# Patient Record
Sex: Female | Born: 1995 | Hispanic: Yes | Marital: Single | Smoking: Never smoker
Health system: Southern US, Community
[De-identification: ages and names within clinical notes are randomized; demographics above are authoritative.]

## PROBLEM LIST (undated history)

## (undated) DIAGNOSIS — R519 Headache, unspecified: Secondary | ICD-10-CM

## (undated) DIAGNOSIS — R51 Headache: Secondary | ICD-10-CM

## (undated) DIAGNOSIS — T148XXA Other injury of unspecified body region, initial encounter: Secondary | ICD-10-CM

## (undated) DIAGNOSIS — Z789 Other specified health status: Secondary | ICD-10-CM

## (undated) HISTORY — PX: NO PAST SURGERIES: SHX2092

---

## 2017-04-20 LAB — OB RESULTS CONSOLE ABO/RH: RH Type: POSITIVE

## 2017-04-20 LAB — OB RESULTS CONSOLE ANTIBODY SCREEN: Antibody Screen: NEGATIVE

## 2017-04-20 LAB — OB RESULTS CONSOLE RUBELLA ANTIBODY, IGM: RUBELLA: IMMUNE

## 2017-04-20 LAB — OB RESULTS CONSOLE GC/CHLAMYDIA
Chlamydia: NEGATIVE
Gonorrhea: NEGATIVE

## 2017-04-20 LAB — OB RESULTS CONSOLE HEPATITIS B SURFACE ANTIGEN: Hepatitis B Surface Ag: NEGATIVE

## 2017-04-20 LAB — OB RESULTS CONSOLE RPR: RPR: NONREACTIVE

## 2017-04-20 LAB — OB RESULTS CONSOLE HIV ANTIBODY (ROUTINE TESTING): HIV: NONREACTIVE

## 2017-07-15 LAB — OB RESULTS CONSOLE GC/CHLAMYDIA
CHLAMYDIA, DNA PROBE: NEGATIVE
GC PROBE AMP, GENITAL: NEGATIVE

## 2017-07-15 LAB — OB RESULTS CONSOLE GBS: GBS: NEGATIVE

## 2017-08-10 ENCOUNTER — Telehealth (HOSPITAL_COMMUNITY): Payer: Self-pay | Admitting: *Deleted

## 2017-08-10 ENCOUNTER — Encounter (HOSPITAL_COMMUNITY): Payer: Self-pay | Admitting: *Deleted

## 2017-08-10 NOTE — Telephone Encounter (Signed)
Preadmission screen  

## 2017-08-11 ENCOUNTER — Other Ambulatory Visit: Payer: Self-pay | Admitting: Advanced Practice Midwife

## 2017-08-14 ENCOUNTER — Inpatient Hospital Stay (HOSPITAL_COMMUNITY): Payer: Medicaid Other | Admitting: Anesthesiology

## 2017-08-14 ENCOUNTER — Inpatient Hospital Stay (HOSPITAL_COMMUNITY)
Admission: AD | Admit: 2017-08-14 | Discharge: 2017-08-17 | DRG: 765 | Disposition: A | Discharge status: Home / Self Care | Payer: Medicaid Other | Source: Ambulatory Visit | Attending: Obstetrics & Gynecology | Admitting: Obstetrics & Gynecology

## 2017-08-14 ENCOUNTER — Encounter (HOSPITAL_COMMUNITY): Payer: Self-pay

## 2017-08-14 ENCOUNTER — Encounter (HOSPITAL_COMMUNITY)
Admission: AD | Disposition: A | Discharge status: Home / Self Care | Payer: Self-pay | Source: Ambulatory Visit | Attending: Obstetrics & Gynecology

## 2017-08-14 DIAGNOSIS — O480 Post-term pregnancy: Principal | ICD-10-CM | POA: Diagnosis present

## 2017-08-14 DIAGNOSIS — O411230 Chorioamnionitis, third trimester, not applicable or unspecified: Secondary | ICD-10-CM | POA: Diagnosis present

## 2017-08-14 DIAGNOSIS — E669 Obesity, unspecified: Secondary | ICD-10-CM | POA: Diagnosis present

## 2017-08-14 DIAGNOSIS — Z6831 Body mass index (BMI) 31.0-31.9, adult: Secondary | ICD-10-CM | POA: Diagnosis not present

## 2017-08-14 DIAGNOSIS — O323XX0 Maternal care for face, brow and chin presentation, not applicable or unspecified: Secondary | ICD-10-CM | POA: Diagnosis present

## 2017-08-14 DIAGNOSIS — Z3A40 40 weeks gestation of pregnancy: Secondary | ICD-10-CM | POA: Diagnosis not present

## 2017-08-14 DIAGNOSIS — Z98891 History of uterine scar from previous surgery: Secondary | ICD-10-CM

## 2017-08-14 DIAGNOSIS — O99214 Obesity complicating childbirth: Secondary | ICD-10-CM | POA: Diagnosis present

## 2017-08-14 DIAGNOSIS — Z3A41 41 weeks gestation of pregnancy: Secondary | ICD-10-CM | POA: Diagnosis present

## 2017-08-14 HISTORY — DX: Other specified health status: Z78.9

## 2017-08-14 LAB — TYPE AND SCREEN
ABO/RH(D): A POS
Antibody Screen: NEGATIVE

## 2017-08-14 LAB — CBC
HCT: 39 % (ref 36.0–46.0)
Hemoglobin: 13.5 g/dL (ref 12.0–15.0)
MCH: 31.2 pg (ref 26.0–34.0)
MCHC: 34.6 g/dL (ref 30.0–36.0)
MCV: 90.1 fL (ref 78.0–100.0)
PLATELETS: 204 10*3/uL (ref 150–400)
RBC: 4.33 MIL/uL (ref 3.87–5.11)
RDW: 14.1 % (ref 11.5–15.5)
WBC: 11.4 10*3/uL — ABNORMAL HIGH (ref 4.0–10.5)

## 2017-08-14 LAB — ABO/RH: ABO/RH(D): A POS

## 2017-08-14 SURGERY — Surgical Case
Anesthesia: Epidural | Site: Abdomen | Wound class: Clean Contaminated

## 2017-08-14 MED ORDER — MEPERIDINE HCL 25 MG/ML IJ SOLN
INTRAMUSCULAR | Status: AC
Start: 2017-08-14 — End: 2017-08-14
  Filled 2017-08-14: qty 1

## 2017-08-14 MED ORDER — EPHEDRINE 5 MG/ML INJ
10.0000 mg | INTRAVENOUS | Status: AC | PRN
  Administered 2017-08-14 (×2): 10 mg via INTRAVENOUS

## 2017-08-14 MED ORDER — METHYLERGONOVINE MALEATE 0.2 MG/ML IJ SOLN
INTRAMUSCULAR | Status: DC | PRN
  Administered 2017-08-14: 0.2 mg via INTRAMUSCULAR

## 2017-08-14 MED ORDER — LACTATED RINGERS IV SOLN
500.0000 mL | INTRAVENOUS | Status: DC | PRN
  Administered 2017-08-14: 500 mL via INTRAVENOUS

## 2017-08-14 MED ORDER — OXYTOCIN 10 UNIT/ML IJ SOLN
INTRAVENOUS | Status: DC | PRN
  Administered 2017-08-14: 40 [IU] via INTRAVENOUS

## 2017-08-14 MED ORDER — CEFAZOLIN SODIUM-DEXTROSE 2-4 GM/100ML-% IV SOLN
INTRAVENOUS | Status: AC
  Filled 2017-08-14: qty 100

## 2017-08-14 MED ORDER — ACETAMINOPHEN 325 MG PO TABS
650.0000 mg | ORAL_TABLET | ORAL | Status: DC | PRN
  Administered 2017-08-14: 650 mg via ORAL
  Filled 2017-08-14: qty 2

## 2017-08-14 MED ORDER — MEPERIDINE HCL 25 MG/ML IJ SOLN
12.5000 mg | Freq: Once | INTRAMUSCULAR | Status: AC
  Administered 2017-08-14: 12.5 mg via INTRAVENOUS
  Filled 2017-08-14: qty 1

## 2017-08-14 MED ORDER — FERROUS SULFATE 325 (65 FE) MG PO TABS
325.0000 mg | ORAL_TABLET | Freq: Two times a day (BID) | ORAL | Status: DC
  Administered 2017-08-15 – 2017-08-17 (×5): 325 mg via ORAL
  Filled 2017-08-14 (×5): qty 1

## 2017-08-14 MED ORDER — OXYCODONE-ACETAMINOPHEN 5-325 MG PO TABS: 2.0000 | ORAL_TABLET | ORAL | Status: DC | PRN

## 2017-08-14 MED ORDER — SENNOSIDES-DOCUSATE SODIUM 8.6-50 MG PO TABS
2.0000 | ORAL_TABLET | ORAL | Status: DC
  Administered 2017-08-15 – 2017-08-16 (×3): 2 via ORAL
  Filled 2017-08-14 (×3): qty 2

## 2017-08-14 MED ORDER — DIBUCAINE 1 % RE OINT: 1.0000 "application " | TOPICAL_OINTMENT | RECTAL | Status: DC | PRN

## 2017-08-14 MED ORDER — MORPHINE SULFATE (PF) 0.5 MG/ML IJ SOLN
INTRAMUSCULAR | Status: DC | PRN
  Administered 2017-08-14: 4 mg via EPIDURAL
  Administered 2017-08-14: 1 mg via INTRAVENOUS

## 2017-08-14 MED ORDER — OXYCODONE-ACETAMINOPHEN 5-325 MG PO TABS: 1.0000 | ORAL_TABLET | ORAL | Status: DC | PRN

## 2017-08-14 MED ORDER — BUPIVACAINE HCL (PF) 0.5 % IJ SOLN
INTRAMUSCULAR | Status: AC
  Filled 2017-08-14: qty 30

## 2017-08-14 MED ORDER — TERBUTALINE SULFATE 1 MG/ML IJ SOLN: 0.2500 mg | Freq: Once | INTRAMUSCULAR | Status: DC | PRN

## 2017-08-14 MED ORDER — LACTATED RINGERS IV SOLN
INTRAVENOUS | Status: DC | PRN
  Administered 2017-08-14 (×3): via INTRAVENOUS

## 2017-08-14 MED ORDER — MEPERIDINE HCL 25 MG/ML IJ SOLN
INTRAMUSCULAR | Status: DC | PRN
  Administered 2017-08-14 (×2): 12.5 mg via INTRAVENOUS

## 2017-08-14 MED ORDER — FENTANYL CITRATE (PF) 100 MCG/2ML IJ SOLN
25.0000 ug | INTRAMUSCULAR | Status: DC | PRN
  Administered 2017-08-14 (×2): 50 ug via INTRAVENOUS

## 2017-08-14 MED ORDER — SIMETHICONE 80 MG PO CHEW
80.0000 mg | CHEWABLE_TABLET | ORAL | Status: DC | PRN
  Administered 2017-08-17: 80 mg via ORAL

## 2017-08-14 MED ORDER — LACTATED RINGERS IV SOLN
INTRAVENOUS | Status: DC
  Administered 2017-08-14 (×4): via INTRAVENOUS

## 2017-08-14 MED ORDER — MEASLES, MUMPS & RUBELLA VAC ~~LOC~~ INJ
0.5000 mL | INJECTION | Freq: Once | SUBCUTANEOUS | Status: DC
  Filled 2017-08-14: qty 0.5

## 2017-08-14 MED ORDER — COCONUT OIL OIL: 1.0000 "application " | TOPICAL_OIL | Status: DC | PRN

## 2017-08-14 MED ORDER — OXYTOCIN 40 UNITS IN LACTATED RINGERS INFUSION - SIMPLE MED
2.5000 [IU]/h | INTRAVENOUS | Status: DC
  Filled 2017-08-14: qty 1000

## 2017-08-14 MED ORDER — DEXAMETHASONE SODIUM PHOSPHATE 4 MG/ML IJ SOLN
INTRAMUSCULAR | Status: DC | PRN
  Administered 2017-08-14: 4 mg via INTRAVENOUS

## 2017-08-14 MED ORDER — SCOPOLAMINE 1 MG/3DAYS TD PT72
MEDICATED_PATCH | TRANSDERMAL | Status: DC | PRN
  Administered 2017-08-14: 1 via TRANSDERMAL

## 2017-08-14 MED ORDER — SODIUM CHLORIDE 0.9 % IJ SOLN
INTRAMUSCULAR | Status: AC
  Filled 2017-08-14: qty 20

## 2017-08-14 MED ORDER — OXYTOCIN 40 UNITS IN LACTATED RINGERS INFUSION - SIMPLE MED: 1.0000 m[IU]/min | INTRAVENOUS | Status: DC

## 2017-08-14 MED ORDER — PHENYLEPHRINE 40 MCG/ML (10ML) SYRINGE FOR IV PUSH (FOR BLOOD PRESSURE SUPPORT)
PREFILLED_SYRINGE | INTRAVENOUS | Status: AC
  Filled 2017-08-14: qty 10

## 2017-08-14 MED ORDER — SCOPOLAMINE 1 MG/3DAYS TD PT72
MEDICATED_PATCH | TRANSDERMAL | Status: AC
Start: 2017-08-14 — End: 2017-08-14
  Filled 2017-08-14: qty 1

## 2017-08-14 MED ORDER — DIPHENHYDRAMINE HCL 50 MG/ML IJ SOLN: 12.5000 mg | INTRAMUSCULAR | Status: DC | PRN

## 2017-08-14 MED ORDER — ACETAMINOPHEN 325 MG PO TABS
650.0000 mg | ORAL_TABLET | ORAL | Status: DC | PRN
  Administered 2017-08-16 – 2017-08-17 (×2): 650 mg via ORAL
  Filled 2017-08-14 (×2): qty 2

## 2017-08-14 MED ORDER — OXYTOCIN 40 UNITS IN LACTATED RINGERS INFUSION - SIMPLE MED
1.0000 m[IU]/min | INTRAVENOUS | Status: DC
  Administered 2017-08-14: 2 m[IU]/min via INTRAVENOUS

## 2017-08-14 MED ORDER — MORPHINE SULFATE (PF) 0.5 MG/ML IJ SOLN
INTRAMUSCULAR | Status: AC
  Filled 2017-08-14: qty 10

## 2017-08-14 MED ORDER — MISOPROSTOL 25 MCG QUARTER TABLET: 25.0000 ug | ORAL_TABLET | ORAL | Status: DC | PRN

## 2017-08-14 MED ORDER — FENTANYL 2.5 MCG/ML BUPIVACAINE 1/10 % EPIDURAL INFUSION (WH - ANES)
INTRAMUSCULAR | Status: AC
  Filled 2017-08-14: qty 100

## 2017-08-14 MED ORDER — ZOLPIDEM TARTRATE 5 MG PO TABS
5.0000 mg | ORAL_TABLET | Freq: Every evening | ORAL | Status: DC | PRN
Start: 2017-08-14 — End: 2017-08-17

## 2017-08-14 MED ORDER — DEXTROSE 5 % IV SOLN
500.0000 mg | Freq: Once | INTRAVENOUS | Status: AC
  Administered 2017-08-14: 500 mg via INTRAVENOUS
  Filled 2017-08-14: qty 500

## 2017-08-14 MED ORDER — ZOLPIDEM TARTRATE 5 MG PO TABS: 5.0000 mg | ORAL_TABLET | Freq: Every evening | ORAL | Status: DC | PRN

## 2017-08-14 MED ORDER — LIDOCAINE HCL (PF) 1 % IJ SOLN: 30.0000 mL | INTRAMUSCULAR | Status: DC | PRN

## 2017-08-14 MED ORDER — MEPERIDINE HCL 25 MG/ML IJ SOLN: 6.2500 mg | INTRAMUSCULAR | Status: DC | PRN

## 2017-08-14 MED ORDER — WITCH HAZEL-GLYCERIN EX PADS: 1.0000 "application " | MEDICATED_PAD | CUTANEOUS | Status: DC | PRN

## 2017-08-14 MED ORDER — PHENYLEPHRINE 40 MCG/ML (10ML) SYRINGE FOR IV PUSH (FOR BLOOD PRESSURE SUPPORT)
80.0000 ug | PREFILLED_SYRINGE | INTRAVENOUS | Status: DC | PRN
  Administered 2017-08-14 (×2): 80 ug via INTRAVENOUS

## 2017-08-14 MED ORDER — CEFAZOLIN SODIUM-DEXTROSE 2-3 GM-% IV SOLR
INTRAVENOUS | Status: DC | PRN
  Administered 2017-08-14: 2 g via INTRAVENOUS

## 2017-08-14 MED ORDER — GENTAMICIN SULFATE 40 MG/ML IJ SOLN
150.0000 mg | Freq: Three times a day (TID) | INTRAMUSCULAR | Status: DC
  Filled 2017-08-14: qty 3.75

## 2017-08-14 MED ORDER — LIDOCAINE HCL (PF) 1 % IJ SOLN
INTRAMUSCULAR | Status: DC | PRN
  Administered 2017-08-14: 4 mL via EPIDURAL
  Administered 2017-08-14: 3 mL via EPIDURAL

## 2017-08-14 MED ORDER — IBUPROFEN 600 MG PO TABS
600.0000 mg | ORAL_TABLET | Freq: Four times a day (QID) | ORAL | Status: DC
  Administered 2017-08-15 – 2017-08-17 (×11): 600 mg via ORAL
  Filled 2017-08-14 (×11): qty 1

## 2017-08-14 MED ORDER — PHENYLEPHRINE 40 MCG/ML (10ML) SYRINGE FOR IV PUSH (FOR BLOOD PRESSURE SUPPORT): 80.0000 ug | PREFILLED_SYRINGE | INTRAVENOUS | Status: DC | PRN

## 2017-08-14 MED ORDER — OXYTOCIN 10 UNIT/ML IJ SOLN
INTRAMUSCULAR | Status: AC
  Filled 2017-08-14: qty 4

## 2017-08-14 MED ORDER — OXYTOCIN 40 UNITS IN LACTATED RINGERS INFUSION - SIMPLE MED: 2.5000 [IU]/h | INTRAVENOUS | Status: AC

## 2017-08-14 MED ORDER — FENTANYL CITRATE (PF) 100 MCG/2ML IJ SOLN
100.0000 ug | INTRAMUSCULAR | Status: DC | PRN
  Administered 2017-08-14 (×2): 100 ug via INTRAVENOUS
  Filled 2017-08-14 (×2): qty 2

## 2017-08-14 MED ORDER — ONDANSETRON HCL 4 MG/2ML IJ SOLN: 4.0000 mg | Freq: Four times a day (QID) | INTRAMUSCULAR | Status: DC | PRN

## 2017-08-14 MED ORDER — FENTANYL CITRATE (PF) 100 MCG/2ML IJ SOLN
INTRAMUSCULAR | Status: AC
Start: 2017-08-14 — End: 2017-08-15
  Filled 2017-08-14: qty 2

## 2017-08-14 MED ORDER — DIPHENHYDRAMINE HCL 25 MG PO CAPS
25.0000 mg | ORAL_CAPSULE | Freq: Four times a day (QID) | ORAL | Status: DC | PRN
Start: 2017-08-14 — End: 2017-08-17

## 2017-08-14 MED ORDER — SOD CITRATE-CITRIC ACID 500-334 MG/5ML PO SOLN
30.0000 mL | ORAL | Status: DC | PRN
  Administered 2017-08-14: 30 mL via ORAL
  Filled 2017-08-14: qty 15

## 2017-08-14 MED ORDER — PHENYLEPHRINE HCL 10 MG/ML IJ SOLN
INTRAMUSCULAR | Status: DC | PRN
  Administered 2017-08-14: 40 ug via INTRAVENOUS
  Administered 2017-08-14: 80 ug via INTRAVENOUS

## 2017-08-14 MED ORDER — LACTATED RINGERS IV SOLN
500.0000 mL | Freq: Once | INTRAVENOUS | Status: AC
  Administered 2017-08-14: 500 mL via INTRAVENOUS

## 2017-08-14 MED ORDER — METOCLOPRAMIDE HCL 5 MG/ML IJ SOLN: 10.0000 mg | Freq: Once | INTRAMUSCULAR | Status: DC | PRN

## 2017-08-14 MED ORDER — LIDOCAINE-EPINEPHRINE (PF) 2 %-1:200000 IJ SOLN
INTRAMUSCULAR | Status: DC | PRN
  Administered 2017-08-14 (×3): 5 mL via EPIDURAL
  Administered 2017-08-14: 4 mL via EPIDURAL
  Administered 2017-08-14 (×2): 5 mL via EPIDURAL
  Administered 2017-08-14: 4 mL via EPIDURAL

## 2017-08-14 MED ORDER — LACTATED RINGERS IV SOLN
INTRAVENOUS | Status: DC
  Administered 2017-08-15: 03:00:00 via INTRAVENOUS

## 2017-08-14 MED ORDER — CEFAZOLIN SODIUM-DEXTROSE 2-4 GM/100ML-% IV SOLN: 2.0000 g | Freq: Once | INTRAVENOUS | Status: DC

## 2017-08-14 MED ORDER — METHYLERGONOVINE MALEATE 0.2 MG/ML IJ SOLN
INTRAMUSCULAR | Status: AC
  Filled 2017-08-14: qty 1

## 2017-08-14 MED ORDER — AMPICILLIN SODIUM 2 G IJ SOLR
2.0000 g | Freq: Four times a day (QID) | INTRAMUSCULAR | Status: DC
  Administered 2017-08-14: 2 g via INTRAVENOUS
  Filled 2017-08-14 (×2): qty 2000

## 2017-08-14 MED ORDER — TETANUS-DIPHTH-ACELL PERTUSSIS 5-2.5-18.5 LF-MCG/0.5 IM SUSP: 0.5000 mL | Freq: Once | INTRAMUSCULAR | Status: DC

## 2017-08-14 MED ORDER — OXYTOCIN BOLUS FROM INFUSION: 500.0000 mL | Freq: Once | INTRAVENOUS | Status: DC

## 2017-08-14 MED ORDER — ONDANSETRON HCL 4 MG/2ML IJ SOLN
INTRAMUSCULAR | Status: DC | PRN
  Administered 2017-08-14: 4 mg via INTRAVENOUS

## 2017-08-14 MED ORDER — DEXAMETHASONE SODIUM PHOSPHATE 4 MG/ML IJ SOLN
INTRAMUSCULAR | Status: AC
  Filled 2017-08-14: qty 1

## 2017-08-14 MED ORDER — FENTANYL 2.5 MCG/ML BUPIVACAINE 1/10 % EPIDURAL INFUSION (WH - ANES)
14.0000 mL/h | INTRAMUSCULAR | Status: DC | PRN
  Administered 2017-08-14: 12 mL/h via EPIDURAL

## 2017-08-14 MED ORDER — MAGNESIUM HYDROXIDE 400 MG/5ML PO SUSP: 30.0000 mL | ORAL | Status: DC | PRN

## 2017-08-14 MED ORDER — PRENATAL MULTIVITAMIN CH
1.0000 | ORAL_TABLET | Freq: Every day | ORAL | Status: DC
  Administered 2017-08-15 – 2017-08-17 (×3): 1 via ORAL
  Filled 2017-08-14 (×3): qty 1

## 2017-08-14 MED ORDER — EPHEDRINE 5 MG/ML INJ
10.0000 mg | INTRAVENOUS | Status: DC | PRN
  Administered 2017-08-14: 10 mg via INTRAVENOUS
  Filled 2017-08-14 (×2): qty 4

## 2017-08-14 MED ORDER — BUPIVACAINE HCL (PF) 0.5 % IJ SOLN
INTRAMUSCULAR | Status: DC | PRN
  Administered 2017-08-14: 30 mL

## 2017-08-14 MED ORDER — MENTHOL 3 MG MT LOZG: 1.0000 | LOZENGE | OROMUCOSAL | Status: DC | PRN

## 2017-08-14 MED ORDER — SIMETHICONE 80 MG PO CHEW
80.0000 mg | CHEWABLE_TABLET | ORAL | Status: DC
  Administered 2017-08-15 – 2017-08-16 (×4): 80 mg via ORAL
  Filled 2017-08-14 (×4): qty 1

## 2017-08-14 SURGICAL SUPPLY — 34 items
CHLORAPREP W/TINT 26ML (MISCELLANEOUS) ×3 IMPLANT
CLAMP CORD UMBIL (MISCELLANEOUS) IMPLANT
CLOTH BEACON ORANGE TIMEOUT ST (SAFETY) ×3 IMPLANT
DRSG OPSITE POSTOP 4X10 (GAUZE/BANDAGES/DRESSINGS) ×3 IMPLANT
ELECT REM PT RETURN 9FT ADLT (ELECTROSURGICAL) ×3
ELECTRODE REM PT RTRN 9FT ADLT (ELECTROSURGICAL) ×1 IMPLANT
EXTRACTOR VACUUM M CUP 4 TUBE (SUCTIONS) ×2 IMPLANT
EXTRACTOR VACUUM M CUP 4' TUBE (SUCTIONS) ×1
GAUZE SPONGE 4X4 12PLY STRL (GAUZE/BANDAGES/DRESSINGS) ×3 IMPLANT
GLOVE BIOGEL PI IND STRL 7.0 (GLOVE) ×4 IMPLANT
GLOVE BIOGEL PI INDICATOR 7.0 (GLOVE) ×8
GLOVE ECLIPSE 7.0 STRL STRAW (GLOVE) ×6 IMPLANT
GOWN STRL REUS W/TWL LRG LVL3 (GOWN DISPOSABLE) ×9 IMPLANT
KIT ABG SYR 3ML LUER SLIP (SYRINGE) IMPLANT
NEEDLE HYPO 22GX1.5 SAFETY (NEEDLE) ×3 IMPLANT
NEEDLE HYPO 25X5/8 SAFETYGLIDE (NEEDLE) ×3 IMPLANT
NS IRRIG 1000ML POUR BTL (IV SOLUTION) ×3 IMPLANT
PACK C SECTION WH (CUSTOM PROCEDURE TRAY) ×3 IMPLANT
PAD ABD 7.5X8 STRL (GAUZE/BANDAGES/DRESSINGS) ×3 IMPLANT
PAD ABD 8X10 STRL (GAUZE/BANDAGES/DRESSINGS) ×3 IMPLANT
PAD OB MATERNITY 4.3X12.25 (PERSONAL CARE ITEMS) ×3 IMPLANT
PENCIL BUTTON HOLSTER BLD 10FT (ELECTRODE) ×3 IMPLANT
PENCIL SMOKE EVAC W/HOLSTER (ELECTROSURGICAL) ×3 IMPLANT
RTRCTR C-SECT PINK 25CM LRG (MISCELLANEOUS) ×3 IMPLANT
SPONGE LAP 18X18 X RAY DECT (DISPOSABLE) ×6 IMPLANT
SUT PDS AB 0 CTX 36 PDP370T (SUTURE) IMPLANT
SUT PLAIN 2 0 XLH (SUTURE) IMPLANT
SUT VIC AB 0 CTX 36 (SUTURE) ×6
SUT VIC AB 0 CTX36XBRD ANBCTRL (SUTURE) ×3 IMPLANT
SUT VIC AB 2-0 CT1 (SUTURE) ×3 IMPLANT
SUT VIC AB 4-0 KS 27 (SUTURE) ×3 IMPLANT
SYR CONTROL 10ML LL (SYRINGE) ×3 IMPLANT
TOWEL OR 17X24 6PK STRL BLUE (TOWEL DISPOSABLE) ×3 IMPLANT
TRAY FOLEY BAG SILVER LF 14FR (SET/KITS/TRAYS/PACK) ×3 IMPLANT

## 2017-08-14 NOTE — Progress Notes (Signed)
Patient evaluated at bedside with Zerita Boers, CNM for recurrent late variable decelerations following adjustment of her epidural. Patient was found to be hypotensive as low as 80s/40s. Started oxygen administration and repositioned patient and started IVF bolus. Patient given 3 doses phenylephrine and 3 doses ephedrine. Finally, blood pressure improved to 150s/90s. FHT improved as well with improvement of BP.  FHT 170/no acels/no decels/ min variability  Continue to monitor closely.   Rolm Bookbinder, DO [place] Fellow

## 2017-08-14 NOTE — Progress Notes (Signed)
Pt began involuntarily shaking and wanting to "push". RN performed sterile vaginal exam, no change from previous exam. Pt reports SHOB. Anesthesia and CNM notified of patient condition.

## 2017-08-14 NOTE — Progress Notes (Signed)
Pt shaking due to pain from contraction

## 2017-08-14 NOTE — Anesthesia Pain Management Evaluation Note (Signed)
  CRNA Pain Management Visit Note  Patient: Angela Decker, 21 y.o., female  "Hello I am a member of the anesthesia team at [facility]. We have an anesthesia team available at all times to provide care throughout the hospital, including epidural management and anesthesia for C-section. I don't know your plan for the delivery whether it a natural birth, water birth, IV sedation, nitrous supplementation, doula or epidural, but we want to meet your pain goals."   1.Was your pain managed to your expectations on prior hospitalizations?   No prior hospitalizations  2.What is your expectation for pain management during this hospitalization?     Epidural  3.How can we help you reach that goal? *support prn**  Record the patient's initial score and the patient's pain goal.   Pain: 0  Pain Goal: 4 [facility] wants you to be able to say your pain was always managed very well.  [facility] 08/14/2017

## 2017-08-14 NOTE — Progress Notes (Signed)
10 mg of ephedrine administered. Marlynn Perking at bedside

## 2017-08-14 NOTE — Op Note (Signed)
Angela Decker PROCEDURE DATE: 08/14/2017  PREOPERATIVE DIAGNOSES: Intrauterine pregnancy at [redacted]w[redacted]d weeks gestation; chorioamnionitis and non-reassuring fetal status  POSTOPERATIVE DIAGNOSES: The same  PROCEDURE: Primary Low Transverse Cesarean Section  SURGEON:  Dr. Jaynie Collins  ASSISTANT:  Dr. Rolm Bookbinder  ANESTHESIOLOGY TEAM: Anesthesiologist: Mal Amabile, MD CRNA: Armanda Heritage, CRNA  INDICATIONS: Angela Decker is a 21 y.o. G1P1000 at [redacted]w[redacted]d here for cesarean section secondary to the indications listed under preoperative diagnoses; please see preoperative note for further details.  The risks of cesarean section were discussed with the patient including but were not limited to: bleeding which may require transfusion or reoperation; infection which may require antibiotics; injury to bowel, bladder, ureters or other surrounding organs; injury to the fetus; need for additional procedures including hysterectomy in the event of a life-threatening hemorrhage; placental abnormalities wth subsequent pregnancies, incisional problems, thromboembolic phenomenon and other postoperative/anesthesia complications.   The patient concurred with the proposed plan, giving informed written consent for the procedure.    FINDINGS:  Viable female infant in cephalic presentation.  Asynclitic, face presentation. Apgars 8 and 9. Nuchal cord x 2.   Clear amniotic fluid.  Intact placenta, three vessel cord.  Normal uterus, fallopian tubes and ovaries bilaterally.  ANESTHESIA: Epidural  INTRAVENOUS FLUIDS: 3100 ml   ESTIMATED BLOOD LOSS: 731 ml URINE OUTPUT:  1450 ml SPECIMENS: Placenta sent to pathology COMPLICATIONS: None immediate  PROCEDURE IN DETAIL:  The patient preoperatively received intravenous antibiotics and had sequential compression devices applied to her lower extremities.  She was then taken to the operating room where the epidural anesthesia was dosed up to surgical leveland  was found to be adequate. She was then placed in a dorsal supine position with a leftward tilt, and prepped and draped in a sterile manner.  A foley catheter was placed into her bladder and attached to constant gravity.  After an adequate timeout was performed, a Pfannenstiel skin incision was made with scalpel and carried through to the underlying layer of fascia. The fascia was incised in the midline, and this incision was extended bilaterally using the Mayo scissors.  Kocher clamps were applied to the superior aspect of the fascial incision and the underlying rectus muscles were dissected off bluntly.  A similar process was carried out on the inferior aspect of the fascial incision. The rectus muscles were separated in the midline bluntly and the peritoneum was entered bluntly. Attention was turned to the lower uterine segment where a low transverse hysterotomy was made with a scalpel and extended bilaterally bluntly.  The infant was successfully delivered, the cord was clamped and cut, after one minute, and the infant was handed over to the awaiting neonatology team. Uterine massage was then administered, and the placenta delivered intact with a three-vessel cord. The uterus was then cleared of clots and debris.  The hysterotomy was closed with 0 Vicryl in a running locked fashion, and an imbricating layer was also placed with 0 Vicryl.  Figure-of-eight 0 Vicryl serosal stitches were placed to help with hemostasis.  The pelvis was cleared of all clot and debris. Hemostasis was confirmed on all surfaces.  The peritoneum was closed with a 0 Vicryl running stitch and the rectus muscles were reapproximated using 0 Vicryl interrupted stitches. The fascia was then closed using 0 Vicryl in a running fashion.  The subcutaneous layer was irrigated, and 30 ml of 0.5% Marcaine was injected subcutaneously around the incision.  The skin was closed with a 4-0 Vicryl subcuticular stitch.  The patient tolerated the procedure  well. Sponge, lap, instrument and needle counts were correct x 3.  She was taken to the recovery room in stable condition.    Jaynie Collins, MD, FACOG Attending Obstetrician & Gynecologist Faculty Practice, [facility]

## 2017-08-14 NOTE — Anesthesia Procedure Notes (Signed)
Epidural Patient location during procedure: OB Start time: 08/14/2017 2:47 PM  Staffing Anesthesiologist: Mal Amabile Performed: anesthesiologist   Preanesthetic Checklist Completed: patient identified, site marked, surgical consent, pre-op evaluation, timeout performed, IV checked, risks and benefits discussed and monitors and equipment checked  Epidural Patient position: sitting Prep: site prepped and draped and DuraPrep Patient monitoring: continuous pulse ox and blood pressure Approach: midline Location: L3-L4 Injection technique: LOR air  Needle:  Needle type: Tuohy  Needle gauge: 17 G Needle length: 9 cm and 9 Needle insertion depth: 5 cm cm Catheter type: closed end flexible Catheter size: 19 Gauge Catheter at skin depth: 10 cm Test dose: negative and Other  Assessment Events: blood not aspirated, injection not painful, no injection resistance, negative IV test and no paresthesia  Additional Notes Patient identified. Risks and benefits discussed including failed block, incomplete  Pain control, post dural puncture headache, nerve damage, paralysis, blood pressure Changes, nausea, vomiting, reactions to medications-both toxic and allergic and post Partum back pain. All questions were answered. Patient expressed understanding and wished to proceed. Sterile technique was used throughout procedure. Epidural site was Dressed with sterile barrier dressing. No paresthesias, signs of intravascular injection Or signs of intrathecal spread were encountered.  Patient was more comfortable after the epidural was dosed. Please see RN's note for documentation of vital signs and FHR which are stable.

## 2017-08-14 NOTE — Anesthesia Procedure Notes (Signed)
Epidural Patient location during procedure: OB Start time: 08/14/2017 3:45 PM  Staffing Anesthesiologist: Mal Amabile Performed: anesthesiologist   Preanesthetic Checklist Completed: patient identified, site marked, surgical consent, pre-op evaluation, timeout performed, IV checked, risks and benefits discussed and monitors and equipment checked  Epidural Patient position: sitting Prep: site prepped and draped and DuraPrep Patient monitoring: continuous pulse ox and blood pressure Approach: midline Location: L3-L4 Injection technique: LOR air  Needle:  Needle type: Tuohy  Needle gauge: 17 G Needle length: 9 cm and 9 Needle insertion depth: 5 cm cm Catheter type: closed end flexible Catheter size: 19 Gauge Catheter at skin depth: 10 cm Test dose: negative and Other  Assessment Events: blood not aspirated, injection not painful, no injection resistance, negative IV test and no paresthesia  Additional Notes Patient identified. Risks and benefits discussed including failed block, incomplete  Pain control, post dural puncture headache, nerve damage, paralysis, blood pressure Changes, nausea, vomiting, reactions to medications-both toxic and allergic and post Partum back pain. All questions were answered. Patient expressed understanding and wished to proceed. Sterile technique was used throughout procedure. Epidural site was Dressed with sterile barrier dressing. No paresthesias, signs of intravascular injection Or signs of intrathecal spread were encountered.  Patient was more comfortable after the epidural was dosed. Please see RN's note for documentation of vital signs and FHR which are stable.\

## 2017-08-14 NOTE — MAU Note (Signed)
I have communicated with Dr. Frances Furbish and reviewed vital signs:  Vitals:   08/14/17 0554  BP: 121/75  Pulse: 74  Resp: 16  Temp: 98.1 F (36.7 C)    Vaginal exam:  Dilation: 1.5 Effacement (%): 60 Cervical Position: Posterior Station: -2 Presentation: Vertex Exam by:: DCALLAWAY, RN  ,   Also reviewed contraction pattern and that non-stress test is reactive.  It has been documented that patient is contracting every 3-6 minutes with no cervical change over 1 hours not indicating active labor.  Patient denies any other complaints.   Dr. Frances Furbish will report off to on coming provider, EFM strip will be reviewed and a decision will be made regarding patient's plan of care.

## 2017-08-14 NOTE — H&P (Signed)
OBSTETRIC ADMISSION HISTORY AND PHYSICAL  Angela Decker is a 21 y.o. female G1P0 with IUP at [redacted]w[redacted]d by 20w [place] presenting for IOL for post-dates. She was scheduled for IOL 9/23 , but came to MAU for false labor and had some variables, so was admitted for earlier IOL. She reports +FMs, No LOF, no VB, no blurry vision, headaches or peripheral edema, and RUQ pain.  She plans on breastfeeding. She request POPs for birth control. She received her prenatal care at [facility]   Dating: By 20w [place] --->  Estimated Date of Delivery: 08/08/17  Sono:   04/26/17 26w No [place] available for review in Epic Imaging tab or Media tab   Prenatal History/Complications:  Past Medical History: Past Medical History:  Diagnosis Date  . Medical history non-contributory     Past Surgical History: Past Surgical History:  Procedure Laterality Date  . NO PAST SURGERIES      Obstetrical History: OB History    Gravida Para Term Preterm AB Living   1             SAB TAB Ectopic Multiple Live Births                  Social History: Social History   Social History  . Marital status: Single    Spouse name: N/A  . Number of children: N/A  . Years of education: N/A   Social History Main Topics  . Smoking status: Never Smoker  . Smokeless tobacco: Never Used  . Alcohol use No  . Drug use: No  . Sexual activity: Not Asked   Other Topics Concern  . None   Social History Narrative  . None    Family History: History reviewed. No pertinent family history.  Allergies: No Known Allergies  Prescriptions Prior to Admission  Medication Sig Dispense Refill Last Dose  . Prenatal Vit-Fe Fumarate-FA (PRENATAL MULTIVITAMIN) TABS tablet Take 1 tablet by mouth daily at 12 noon.   08/13/2017 at Unknown time     Review of Systems   All systems reviewed and negative except as stated in HPI  Blood pressure 123/85, pulse 96, temperature 98.7 F (37.1 C), temperature source Oral, resp. rate 18, height 5' 2.5"  (1.588 m), weight 79.8 kg (176 lb). General appearance: alert, cooperative and no distress Lungs: clear to auscultation bilaterally Heart: regular rate and rhythm Abdomen: soft, non-tender; bowel sounds normal Pelvic: adequate Extremities: Homans sign is negative, no sign of DVT Presentation: cephalic Fetal monitoring: Baseline: 150 bpm, Variability: Good {> 6 bpm), Accelerations: Reactive and Decelerations: Absent Uterine activity q8 Dilation: 1.5 Effacement (%): 60 Station: -2 Exam by:: DCALLAWAY, RN     Prenatal labs: ABO, Rh: A/Positive/-- (05/29 0000) Antibody: Negative (05/29 0000) Rubella: Immune (05/29 0000) RPR: Nonreactive (05/29 0000)  HBsAg: Negative (05/29 0000)  HIV: Non-reactive (05/29 0000)  GBS: Negative (08/23 0000)  1 hr Glucola not done Genetic screening not done - too late to care Anatomy US not available  Prenatal Transfer Tool  Maternal Diabetes: Not done Genetic Screening: Too late to care Maternal Ultrasounds/Referrals: Not available for review Fetal Ultrasounds or other Referrals:  None Maternal Substance Abuse:  No Significant Maternal Medications:  None Significant Maternal Lab Results: None  No results found for this or any previous visit (from the past 24 hour(s)).  Patient Active Problem List   Diagnosis Date Noted  . Post term pregnancy at [redacted] weeks gestation 08/14/2017    Assessment/Plan:  Angela Decker is  a 21 y.o. G1P0 at [redacted]w[redacted]d here for IOL for post dates and variables in MAU when presenting for false labor.  #Labor: s/p FB, will also start pitocin #Pain: Considering epidural #FWB:  cat 1, reassuring - no variables since transfer from MAU #ID:  GBS negative #MOF: breast #MOC: POPs #Circ:  unsure  Burnard Leigh, MD  08/14/2017, 9:47 AM

## 2017-08-14 NOTE — Progress Notes (Signed)
ANTIBIOTIC CONSULT NOTE - INITIAL  Pharmacy Consult for Gentamicin Indication: Chorioamnionitis   No Known Allergies  Patient Measurements: Height: 5' 2.5" (158.8 cm) Weight: 176 lb (79.8 kg) IBW/kg (Calculated) : 51.25 Adjusted Body Weight: 60 kg  Vital Signs: Temp: 100.7 F (38.2 C) (09/22 1801) Temp Source: Oral (09/22 1801) BP: 134/77 (09/22 1815) Pulse Rate: 109 (09/22 1815)  Labs:  Recent Labs  08/14/17 0931  WBC 11.4*  HGB 13.5  PLT 204   No results for input(s): GENTTROUGH, GENTPEAK, GENTRANDOM in the last 72 hours.   Microbiology: No results found for this or any previous visit (from the past 720 hour(s)).  Medications:  Ampicillin 2gm IV q6hrs   Assessment: 21 y.o. female G1P0 at [redacted]w[redacted]d on amp and gent for chorio.  Estimated Ke = 0.03 , Vd =24L Goal of Therapy:  Gentamicin peak 6-8 mg/L and Trough < 1 mg/L  Plan:  Gentamicin 150 mg IV every 8 hrs  Check Scr with next labs if gentamicin continued. Will check gentamicin levels if continued > 72hr or clinically indicated.  Wendie Simmer, PharmD, BCPS Clinical Pharmacist

## 2017-08-14 NOTE — Progress Notes (Signed)
Pt sitting for epidural

## 2017-08-14 NOTE — Anesthesia Postprocedure Evaluation (Signed)
Anesthesia Post Note  Patient: Angela Decker  Procedure(s) Performed: Procedure(s) (LRB): CESAREAN SECTION (N/A)     Patient location during evaluation: PACU Anesthesia Type: Epidural Level of consciousness: awake and alert and oriented Pain management: pain level controlled Vital Signs Assessment: post-procedure vital signs reviewed and stable Respiratory status: spontaneous breathing, nonlabored ventilation and respiratory function stable Cardiovascular status: blood pressure returned to baseline and stable Postop Assessment: no headache, no backache, epidural receding, patient able to bend at knees and no apparent nausea or vomiting Anesthetic complications: no    Last Vitals:  Vitals:   08/14/17 2200 08/14/17 2215  BP: 134/77   Pulse: 89 93  Resp: (!) 22 (!) 21  Temp:    SpO2: 97% 96%    Last Pain:  Vitals:   08/14/17 2145  TempSrc: Oral  PainSc:    Pain Goal: Patients Stated Pain Goal: 1 (08/14/17 1610)               Shantrell Placzek A.

## 2017-08-14 NOTE — Progress Notes (Signed)
Shaking after epidural. FHR 150, mod, +accels, no decels. Increase pit. IUPC placed. Burna Cash, MD Family Medicine Resident, PGY-3 08/14/2017 4:27 PM

## 2017-08-14 NOTE — Progress Notes (Signed)
Labor Progress Note Angela Decker is a 21 y.o. G1P0 at [redacted]w[redacted]d presented for IOL for postdates S: No complaints  O:  BP 123/69   Pulse 85   Temp 98.3 F (36.8 C) (Oral)   Resp 18   Ht 5' 2.5" (1.588 m)   Wt 79.8 kg (176 lb)   BMI 31.68 kg/m  EFM: 140/pos acels/no decels  CVE: Dilation: 8 Effacement (%): 80 Cervical Position: Posterior Station: -1 Presentation: Vertex Exam by:: Marlynn Perking CNM   A&P: 21 y.o. G1P0 [redacted]w[redacted]d here for IOL for postdates #Labor: Progressing well. Continue to monitor. SROM noted during exam.  #GBS negative   Hosea Hanawalt, DO 2:09 PM

## 2017-08-14 NOTE — Progress Notes (Signed)
Dr Malen Gauze and Marlynn Perking aware, pt shaking, unable to obtain accurate BP.

## 2017-08-14 NOTE — Progress Notes (Signed)
Patient ID: Angela Decker, female   DOB: 05-15-1996, 22 y.o.   MRN: [id number] S: comfortable from epidural O: pt now febrile, fetal tachycardia, no decels at present time. Dr. Macon Large given progress report. A: Triple I P: tylenol po, start antibiotics, close obs

## 2017-08-14 NOTE — Progress Notes (Signed)
Pt on birthing ball. BP rechecked d/t maternal movement.

## 2017-08-14 NOTE — Progress Notes (Signed)
Dr Malen Gauze stopped epidural pump

## 2017-08-14 NOTE — Progress Notes (Signed)
Ephedrine administered. Angela Decker at bedside

## 2017-08-14 NOTE — Anesthesia Preprocedure Evaluation (Addendum)
Anesthesia Evaluation  Patient identified by MRN, date of birth, ID band Patient awake    Reviewed: Allergy & Precautions, NPO status , Patient's Chart, lab work & pertinent test results  Airway Mallampati: III  TM Distance: >3 FB Neck ROM: Full    Dental no notable dental hx. (+) Teeth Intact   Pulmonary neg pulmonary ROS,    Pulmonary exam normal breath sounds clear to auscultation       Cardiovascular negative cardio ROS Normal cardiovascular exam Rhythm:Regular Rate:Normal     Neuro/Psych negative neurological ROS  negative psych ROS   GI/Hepatic Neg liver ROS, GERD  ,  Endo/Other  Obesity  Renal/GU negative Renal ROS  negative genitourinary   Musculoskeletal negative musculoskeletal ROS (+)   Abdominal (+) + obese,   Peds  Hematology negative hematology ROS (+)   Anesthesia Other Findings   Reproductive/Obstetrics (+) Pregnancy                            Anesthesia Physical Anesthesia Plan  ASA: II and emergent  Anesthesia Plan: Epidural   Post-op Pain Management:    Induction:   PONV Risk Score and Plan:   Airway Management Planned: Natural Airway  Additional Equipment:   Intra-op Plan:   Post-operative Plan:   Informed Consent: I have reviewed the patients History and Physical, chart, labs and discussed the procedure including the risks, benefits and alternatives for the proposed anesthesia with the patient or authorized representative who has indicated his/her understanding and acceptance.   Dental advisory given  Plan Discussed with: Anesthesiologist, CRNA and Surgeon  Anesthesia Plan Comments: (C/Section for non reassuring FHR tracing. Will use epidural for C/Section. M. Malen Gauze, MD)       Anesthesia Quick Evaluation

## 2017-08-14 NOTE — Transfer of Care (Signed)
Immediate Anesthesia Transfer of Care Note  Patient: Angela Decker  Procedure(s) Performed: Procedure(s): CESAREAN SECTION (N/A)  Patient Location: PACU  Anesthesia Type:Epidural  Level of Consciousness: awake, alert  and oriented  Airway & Oxygen Therapy: Patient Spontanous Breathing  Post-op Assessment: Report given to RN and Post -op Vital signs reviewed and stable  Post vital signs: Reviewed and stable  Last Vitals:  Vitals:   08/14/17 1845 08/14/17 1900  BP: 120/68 118/65  Pulse: (!) 113 (!) 121  Resp: 18 18  Temp:      Last Pain:  Vitals:   08/14/17 1801  TempSrc: Oral  PainSc:       Patients Stated Pain Goal: 1 (08/14/17 0981)  Complications: No apparent anesthesia complications

## 2017-08-14 NOTE — Progress Notes (Signed)
Angela Decker, Dr Renaldo Harrison, and resident at bedside.

## 2017-08-14 NOTE — Progress Notes (Signed)
10 mg ephedrine administered. Marlynn Perking at bedside.

## 2017-08-14 NOTE — Progress Notes (Signed)
Angela Decker, CNM reviewed strip and performed sterile vaginal exam.  Notified Dr Macon Large of patient condition and MD reviewed strip and is ok with the tracing at this time. Orders received to restart pitocin at 3 milliunit  and increase by 1 milliunit.     08/14/17 1815  Vital Signs  BP 134/77  Pulse Rate (!) 109  Resp 18  Provider Notification  Provider Name/Title Zerita Boers, CNM  Method of Notification At bedside

## 2017-08-14 NOTE — MAU Note (Signed)
ctxs since 12mn with some bloody show. For IOL tonight but came now due to ctx

## 2017-08-14 NOTE — Progress Notes (Signed)
Faculty Practice OB/GYN Attending Note   Subjective:  Called to evaluate patient with recurrent variable/late decelerations after pitocin was restarted.  Patient recently diagnosed with Triple I; on Amp/Gent. Fetal tachycardia present.     Objective:  Blood pressure 118/65, pulse (!) 121, temperature (!) 100.7 F (38.2 C), temperature source Oral, resp. rate 18, height 5' 2.5" (1.588 m), weight 176 lb (79.8 kg). FHT  Baseline 180 bpm, minimal variability, np accelerations, variable/late decelerations Toco: q 1-3 min Gen: NAD HENT: Normocephalic, atraumatic Lungs: Normal respiratory effort Heart: Regular rate noted Abdomen: NT gravid fundus, soft Cervix: 7-8/90/-1 Ext: 2+ DTRs, no edema, no cyanosis, negative Homan's sign  Assessment & Plan:  21 y.o. G1P0 at [redacted]w[redacted]d admitted for IOL; now with NRFHT remote from vaginal delivery. Cesarean delivery recommended.  The risks of cesarean section discussed with the patient included but were not limited to: bleeding which may require transfusion or reoperation; infection which may require antibiotics; injury to bowel, bladder, ureters or other surrounding organs; injury to the fetus; need for additional procedures including hysterectomy in the event of a life-threatening hemorrhage; placental abnormalities wth subsequent pregnancies, incisional problems, thromboembolic phenomenon and other postoperative/anesthesia complications. The patient concurred with the proposed plan, giving informed written consent for the procedure.   Anesthesia and OR aware. Preoperative prophylactic antibiotics (Ancef and Azithromycin) and SCDs ordered on call to the OR.  To OR when ready.   Jaynie Collins, MD, FACOG Attending Obstetrician & Gynecologist Faculty Practice, [facility]

## 2017-08-15 ENCOUNTER — Ambulatory Visit (HOSPITAL_COMMUNITY): Admission: RE | Admit: 2017-08-15 | Payer: Self-pay | Source: Ambulatory Visit

## 2017-08-15 ENCOUNTER — Encounter (HOSPITAL_COMMUNITY): Payer: Self-pay | Admitting: Obstetrics & Gynecology

## 2017-08-15 LAB — CBC
HEMATOCRIT: 35.4 % — AB (ref 36.0–46.0)
HEMOGLOBIN: 12 g/dL (ref 12.0–15.0)
MCH: 30.7 pg (ref 26.0–34.0)
MCHC: 33.9 g/dL (ref 30.0–36.0)
MCV: 90.5 fL (ref 78.0–100.0)
Platelets: 166 10*3/uL (ref 150–400)
RBC: 3.91 MIL/uL (ref 3.87–5.11)
RDW: 14.7 % (ref 11.5–15.5)
WBC: 21.2 10*3/uL — AB (ref 4.0–10.5)

## 2017-08-15 LAB — RPR: RPR: NONREACTIVE

## 2017-08-15 MED ORDER — DIPHENHYDRAMINE HCL 25 MG PO CAPS: 25.0000 mg | ORAL_CAPSULE | ORAL | Status: DC | PRN

## 2017-08-15 MED ORDER — INFLUENZA VAC SPLIT QUAD 0.5 ML IM SUSY: 0.5000 mL | PREFILLED_SYRINGE | INTRAMUSCULAR | Status: DC

## 2017-08-15 MED ORDER — DIPHENHYDRAMINE HCL 50 MG/ML IJ SOLN: 12.5000 mg | INTRAMUSCULAR | Status: DC | PRN

## 2017-08-15 MED ORDER — NALBUPHINE HCL 10 MG/ML IJ SOLN: 5.0000 mg | Freq: Once | INTRAMUSCULAR | Status: DC | PRN

## 2017-08-15 MED ORDER — NALBUPHINE HCL 10 MG/ML IJ SOLN: 5.0000 mg | INTRAMUSCULAR | Status: DC | PRN

## 2017-08-15 MED ORDER — ONDANSETRON HCL 4 MG/2ML IJ SOLN: 4.0000 mg | Freq: Three times a day (TID) | INTRAMUSCULAR | Status: DC | PRN

## 2017-08-15 MED ORDER — KETOROLAC TROMETHAMINE 30 MG/ML IJ SOLN: 30.0000 mg | Freq: Four times a day (QID) | INTRAMUSCULAR | Status: AC | PRN

## 2017-08-15 MED ORDER — NALOXONE HCL 2 MG/2ML IJ SOSY
1.0000 ug/kg/h | PREFILLED_SYRINGE | INTRAVENOUS | Status: DC | PRN
  Filled 2017-08-15: qty 2

## 2017-08-15 MED ORDER — NALOXONE HCL 0.4 MG/ML IJ SOLN: 0.4000 mg | INTRAMUSCULAR | Status: DC | PRN

## 2017-08-15 MED ORDER — SODIUM CHLORIDE 0.9% FLUSH: 3.0000 mL | INTRAVENOUS | Status: DC | PRN

## 2017-08-15 NOTE — Anesthesia Postprocedure Evaluation (Signed)
Anesthesia Post Note  Patient: Angela Decker  Procedure(s) Performed: Procedure(s) (LRB): CESAREAN SECTION (N/A)     Patient location during evaluation: Mother Baby Anesthesia Type: Epidural Level of consciousness: awake and alert and oriented Pain management: pain level controlled Vital Signs Assessment: post-procedure vital signs reviewed and stable Respiratory status: spontaneous breathing and nonlabored ventilation Cardiovascular status: stable Postop Assessment: no headache, patient able to bend at knees, no backache, no apparent nausea or vomiting, epidural receding and adequate PO intake Anesthetic complications: no    Last Vitals:  Vitals:   08/15/17 0240 08/15/17 0625  BP:  (!) 105/53  Pulse:  64  Resp: 18 18  Temp: 37.2 C 36.5 C  SpO2: 98% 98%    Last Pain:  Vitals:   08/15/17 0625  TempSrc: Oral  PainSc:    Pain Goal: Patients Stated Pain Goal: 1 (08/14/17 0623)               Angela Decker

## 2017-08-15 NOTE — Addendum Note (Signed)
Addendum  created 08/15/17 0801 by Elgie Congo, CRNA   Charge Capture section accepted, Sign clinical note

## 2017-08-15 NOTE — Lactation Note (Signed)
This note was copied from a baby's chart. Lactation Consultation Note  Patient Name: Angela Decker Today's Date: 08/15/2017 Reason for consult: Initial assessment   With this first time mom and term baby, now 58 hours old. Mom is mostly formula feeding, states baby breast feeding a little. Mom had just formula fed baby, so I asked her to call for lactation when baby next shows hunger cues. I will call the Spanish inter[preter to be with me to do BF teaching. I did give mom the Spanish lactation folder.     Maternal Data Formula Feeding for Exclusion: Yes Reason for exclusion: Mother's choice to formula and breast feed on admission Has patient been taught Hand Expression?: Yes Does the patient have breastfeeding experience prior to this delivery?: No  Feeding    LATCH Score    Audible Swallowing:  (easily expressed colostrum)  Type of Nipple: Flat  Comfort (Breast/Nipple): Soft / non-tender        Interventions Interventions: Breast feeding basics reviewed;Hand express  Lactation Tools Discussed/Used     Consult Status Consult Status: Follow-up Date: 08/16/17 Follow-up type: In-patient    Alfred Levins 08/15/2017, 1:48 PM

## 2017-08-15 NOTE — Progress Notes (Signed)
Post Operative Day 1  Subjective:  Angela Decker is a 21 y.o. G1P1000 [redacted]w[redacted]d s/p rLTCS.  No acute events overnight.  Pt denies problems with ambulating, voiding or po intake.  She denies nausea or vomiting.  Pain is well controlled.  She has had flatus. She has not had bowel movement.  Lochia Minimal.  Plan for birth control is oral progesterone-only contraceptive.  Method of Feeding: breast  Objective: BP (!) 105/53 (BP Location: Left Arm)   Pulse 64   Temp 97.7 F (36.5 C) (Oral)   Resp 18   Ht 5' 2.5" (1.588 m)   Wt 79.8 kg (176 lb)   SpO2 98%   Breastfeeding? Unknown   BMI 31.68 kg/m    Adequate urine in foley  Physical Exam:  General: alert, cooperative and no distress Lochia:normal flow Chest: CTAB Heart: RRR no m/r/g Abdomen: +BS, soft, nontender, fundus firm at/below umbilicus Uterine Fundus: firm at umbilicus Wound: pressure dressing in place DVT Evaluation: No evidence of DVT seen on physical exam. Extremities: no edema   Recent Labs  08/14/17 0931 08/15/17 0510  HGB 13.5 12.0  HCT 39.0 35.4*    Assessment/Plan:  ASSESSMENT: Angela Decker is a 21 y.o. G1P1000 [redacted]w[redacted]d POD#1 s/p rLTCS for NRFHT, doing well.  #POD#1: remove foley catheter and have patient ambulate today, hgb 13.5 > 12 #Triple I: Tmax post-op 100.7 , s/p amp x1 + azithro x1 #Limited PNC #MOF: breast #MOC: POPs   Plan for discharge tomorrow vs. The next day pending routine couplet care and meeting post-op milestones   LOS: 1 day   Jenna Diggs 08/15/2017, 7:23 AM  I assessed this pt and agree with above assessment

## 2017-08-15 NOTE — Progress Notes (Signed)
Attempted to walk patient to bathroom to remove foley with interpreters assistance.  Pt began to feel dizzy, unable to walk pt to bathroom to remove foley.  Peri care performed and pad changed at beside.  Will attempt to remove foley after patient eats lunch.

## 2017-08-16 NOTE — Plan of Care (Signed)
Problem: Role Relationship: Goal: Ability to demonstrate positive interaction with newborn will improve Outcome: Completed/Met Date Met: 08/16/17 Patient is bonding well with newborn.    

## 2017-08-16 NOTE — Progress Notes (Signed)
Subjective: Postpartum Day 3: Cesarean Delivery Patient reports incisional pain, + flatus and no problems voiding.    Objective: Vital signs in last 24 hours: Temp:  [97.8 F (36.6 C)-98.2 F (36.8 C)] 97.8 F (36.6 C) (09/24 0612) Pulse Rate:  [60-82] 60 (09/24 0612) Resp:  [17-18] 18 (09/24 0612) BP: (99-109)/(43-55) 108/46 (09/24 0612) SpO2:  [98 %-100 %] 98 % (09/24 0612)  Physical Exam:  General: alert, cooperative and no distress Lochia: appropriate Uterine Fundus: firm Incision: healing well DVT Evaluation: No evidence of DVT seen on physical exam.   Recent Labs  08/14/17 0931 08/15/17 0510  HGB 13.5 12.0  HCT 39.0 35.4*    Assessment/Plan: Status post Cesarean section. Doing well postoperatively.  DC home tomorrow. Patient still needing lactation support, pain management, and baby staying tonight as well.   Thressa Sheller 08/16/2017, 10:06 AM

## 2017-08-16 NOTE — Lactation Note (Signed)
This note was copied from a baby's chart. Lactation Consultation Note  Patient Name: Angela Decker Today's Date: 08/16/2017   Mom is pleased with using nipple shield at breast. Infant's position was changed to improve latch & increase milk transfer. With breast compressions, there were multiple, frequent swallows.  Mom's breasts are more full than they were 4 hours ago. I anticipate that her milk will come to volume over night. I instructed her to let her RN know if her breasts start becoming too full.   Bonnye Fava, interpreter, present for consult. RN was informed to be on the lookout for possible engorgement overnight/tomorrow morning.   Lurline [facility]are [facility] 08/16/2017, 10:32 PM

## 2017-08-16 NOTE — Lactation Note (Addendum)
This note was copied from a baby's chart. Lactation Consultation Note  Patient Name: Angela Decker Today's Date: 08/16/2017   Mom said she had not been putting infant to breast b/c she didn't "have a nipple." Breasts were visualized; she has nipples that are somewhat flat. With the interpreter, I suggested using a nipple shield. Mom said she already had one, but that the infant didn't like it. I offered to return and prefill the nipple shield w/colostrum or formula to see if that would entice baby.  Hand pump provided & Mom was shown how to disassemble, clean, & assemble parts. Mom's nipple size suggests that she needs size 21 flanges, which were provided. Mom does have WIC.   Mother's feeding intention is to breast/formula. Regular Similac provided.   Craig Staggers, RN present during consult.   Lurline Hare [facility] 08/16/2017, 6:45 PM

## 2017-08-17 DIAGNOSIS — Z3A40 40 weeks gestation of pregnancy: Secondary | ICD-10-CM

## 2017-08-17 DIAGNOSIS — O411230 Chorioamnionitis, third trimester, not applicable or unspecified: Secondary | ICD-10-CM

## 2017-08-17 MED ORDER — OXYCODONE-ACETAMINOPHEN 5-325 MG PO TABS
1.0000 | ORAL_TABLET | Freq: Four times a day (QID) | ORAL | 0 refills | Status: DC | PRN
Start: 2017-08-17 — End: 2018-11-30

## 2017-08-17 MED ORDER — IBUPROFEN 600 MG PO TABS: 600.0000 mg | ORAL_TABLET | Freq: Four times a day (QID) | ORAL | 0 refills | Status: DC

## 2017-08-17 MED ORDER — SENNOSIDES-DOCUSATE SODIUM 8.6-50 MG PO TABS: 2.0000 | ORAL_TABLET | Freq: Every evening | ORAL | 1 refills | Status: AC | PRN

## 2017-08-17 NOTE — Progress Notes (Signed)
Mother instructed via interpreter to take off honey comb in 5 days and watch for infection.

## 2017-08-17 NOTE — Lactation Note (Signed)
This note was copied from a baby's chart. Lactation Consultation Note  Patient Name: Angela Decker KMMNO'T Date: 08/17/2017 Reason for consult: Follow-up assessment;Infant weight loss (7% weight loss , using a NS to latch , limited [place] [employer] present/ Spanish interpreter )  Angela Decker is 61 hours old. Mom mentioned to the RN that the last feeding was EBM she had pumped off with the hand pump.  Grandma mentioned to the [facility] it was 45 ml .  Per mom latching is more comfortable with using the Nipple shield due to flat nipples.  Also the #21 flanges are uncomfortable and hurt . LC recommended moving up to the #24 Flange.  LC instructed mom on the use shells to enhance compressing areola.  LC noted both breast to be full to boarder line engorged. Lane inform the [facility]URN and asked the [facility] to provide  Mom with 3 ice packs to ice and then either feed the baby at the breast or pump.  [place] notified [place] for DEBP loaner. [place] doesn't have enough loaners at this time .  Mom receptive to obtaining a DEBP from [facility] for [place].     Maternal Data    Feeding Feeding Type:  (baby was just finishing feeding as LC walked in )  [facility] Score                   Interventions Interventions: Breast feeding basics reviewed  Lactation Tools Discussed/Used Tools: Shells;Pump (LC to instruct ) Nipple shield size: 20 Shell Type: Inverted Breast pump type: Manual;Double-Electric Breast Pump (will provide DEBP kit for her WIC DEBP ) WIC Program: Yes (per mom was signed up yesterday )   Consult Status Consult Status: Follow-up Date: 08/17/17 Follow-up type: In-patient    Angela Decker 08/17/2017, 9:25 AM

## 2017-08-17 NOTE — Discharge Summary (Signed)
OB Discharge Summary     Patient Name: Angela Decker DOB: [birth year] MRN: [id number]  Date of admission: 08/14/2017 Delivering MD: Jaynie Collins A   Date of discharge: 08/18/2017  Admitting diagnosis: PREG Intrauterine pregnancy: [redacted]w[redacted]d     Secondary diagnosis:  Active Problems:   Post term pregnancy at [redacted] weeks gestation   Status post primary low transverse cesarean section  Additional problems: None     Discharge diagnosis: Term Pregnancy Delivered                                                                                                Post partum procedures:none  Augmentation: Pitocin and Foley Balloon  Complications: Intrauterine Inflammation or infection (Chorioamniotis)  Hospital course:  Induction of Labor With Cesarean Section  21 y.o. yo G1P1000 at [redacted]w[redacted]d was admitted to the hospital 08/14/2017 for induction of labor. Patient had a labor course significant for Triple I. The patient went for cesarean section due to Non-Reassuring FHR, and delivered a Viable infant,@BABYSUPPRESS (DBLINK,ept,110,,1,,) Membrane Rupture Time/Date: )2:08 PM ,08/14/2017   Details of operation can be found in separate operative Note.  Patient had an uncomplicated postpartum course. She is ambulating, tolerating a regular diet, passing flatus, and urinating well.  Patient is discharged home in stable condition on 08/18/17.                                    Physical exam  Vitals:   08/15/17 2026 08/16/17 0612 08/16/17 1715 08/17/17 0523  BP: (!) 102/55 (!) 108/46 118/67 (!) 116/55  Pulse: 61 60 67 (!) 55  Resp: Temp: 98.2 F (36.8 C) 97.8 F (36.6 C) 98.6 F (37 C) 98.9 F (37.2 C)  TempSrc: Oral  Oral   SpO2: 99% 98% 98% 98%  Weight:      Height:       General: alert, cooperative and no distress Lochia: appropriate Uterine Fundus: firm Incision: Dressing is clean, dry, and intact DVT Evaluation: No evidence of DVT seen on physical exam. Labs: Lab Results   Component Value Date   WBC 21.2 (H) 08/15/2017   HGB 12.0 08/15/2017   HCT 35.4 (L) 08/15/2017   MCV 90.5 08/15/2017   PLT 166 08/15/2017   No flowsheet data found.  Discharge instruction: per After Visit Summary and "Baby and Me Booklet".  After visit meds:  Allergies as of 08/17/2017   No Known Allergies     Medication List    TAKE these medications   ibuprofen 600 MG tablet Commonly known as:  ADVIL,MOTRIN Take 1 tablet (600 mg total) by mouth every 6 (six) hours.   oxyCODONE-acetaminophen 5-325 MG tablet Commonly known as:  PERCOCET/ROXICET Take 1 tablet by mouth every 6 (six) hours as needed.   prenatal multivitamin Tabs tablet Take 1 tablet by mouth daily at 12 noon.   senna-docusate 8.6-50 MG tablet Commonly known as:  Senokot-S Take 2 tablets by mouth at bedtime as needed for mild constipation.  Discharge Care Instructions        Start     Ordered   08/17/17 0000  ibuprofen (ADVIL,MOTRIN) 600 MG tablet  Every 6 hours     08/17/17 0850   08/17/17 0000  senna-docusate (SENOKOT-S) 8.6-50 MG tablet  At bedtime PRN     08/17/17 0850   08/17/17 0000  oxyCODONE-acetaminophen (PERCOCET/ROXICET) 5-325 MG tablet  Every 6 hours PRN     08/17/17 0850      Diet: routine diet  Activity: Advance as tolerated. Pelvic rest for 6 weeks.   Outpatient follow up:4 weeks Follow up Appt:No future appointments. Follow up Visit:  Follow-up Information    Department, [facility]. Schedule an appointment as soon as possible for a visit.   Why:  Schedule postpartum appointment in 4 weeks Contact information: [address] [place] [address] [phone]           Postpartum contraception: Progesterone only pills  Newborn Data: Live born female  Birth Weight: 7 lb 9 oz (3430 g) APGAR: 8, 9  Baby Feeding: Breast Disposition:home with mother   Caryl Ada, DO

## 2017-08-17 NOTE — Discharge Instructions (Signed)
Parto por cesárea, cuidados posteriores °(Cesarean Delivery, Care After) °Siga estas instrucciones durante las próximas semanas. Estas indicaciones le proporcionan información acerca de cómo deberá cuidarse después del procedimiento. El médico también podrá darle instrucciones más específicas. El tratamiento ha sido planificado según las prácticas médicas actuales, pero en algunos casos pueden ocurrir problemas. Comuníquese con el médico si tiene algún problema o dudas después del procedimiento. °QUÉ ESPERAR DESPUÉS DEL PROCEDIMIENTO °Después del procedimiento, es común tener los siguientes síntomas: °· Una pequeña cantidad de sangre o un líquido transparente que sale de la incisión. °· Tiene enrojecimiento, hinchazón o dolor en la zona de la incisión. °· Dolores y molestias abdominales. °· Hemorragia vaginal (loquios). °· Calambres pélvicos. °· Fatiga. °INSTRUCCIONES PARA EL CUIDADO EN EL HOGAR °Cuidado de la incisión °· Siga las indicaciones del médico acerca del cuidado de la incisión. Haga lo siguiente: °? Lávese las manos con agua y jabón antes de cambiar las vendas (vendaje). Use desinfectante para manos si no dispone de agua y jabón. °? Cambie el vendaje como se lo haya indicado el médico. °? No retire los puntos (suturas), las grapas cutáneas, el pegamento para la piel o las tiras adhesivas. Es posible que estos deban quedar puestos en la piel durante 2 semanas o más tiempo. Si los bordes de las tiras adhesivas empiezan a despegarse y enroscarse, puede recortar los que están sueltos. No retire las tiras adhesivas por completo a menos que el médico se lo indique. °· Controle todos los días la zona de la incisión para detectar signos de infección. Esté atenta a los siguientes signos: °? Aumento del enrojecimiento, la hinchazón o el dolor. °? Más líquido o sangre. °? Calor. °? Pus o mal olor. °· Cuando tosa o estornude, abrace una almohada. Esto ayuda con el dolor y disminuye la posibilidad de que su incisión  se abra (dehiscencia). Haga esto hasta que cicatrice completamente. °Medicamentos °· Tome los medicamentos de venta libre y los recetados solamente como se lo haya indicado el médico. °· Si le recetaron un antibiótico, tómelo como se lo haya indicado el médico. No interrumpa la administración del antibiótico hasta que no lo hay terminado. °Conducir °· No conduzca ni opere maquinaria pesada mientras toma analgésicos recetados. °· No conduzca durante 24 horas si le administraron un sedante. °Estilo de vida °· No beba alcohol. Esto es de suma importancia si está amamantando o toma analgésicos. °· No consuma productos que contengan tabaco, incluidos cigarrillos, tabaco de mascar o cigarrillos electrónicos. Si necesita ayuda para dejar de fumar, consulte al médico. El tabaco puede retrasar la cicatrización. °Comida y bebida °· Beba al menos 8 vasos de 8 onzas (240 cc) de agua todos los días a menos que el médico le indique lo contrario. Si amamanta, quizá deba beber aún más cantidad de agua. °· Ingiera alimentos ricos en fibras todos los días. Estos alimentos pueden ayudarla a prevenir o aliviar el estreñimiento. Los alimentos ricos en fibras incluyen, entre otros: °? Panes y cereales integrales. °? Arroz integral. °? Frijoles. °? Frutas y verduras frescas. °Actividad °· Reanude sus actividades normales como se lo haya indicado el médico. Pregúntele al médico qué actividades son seguras para usted. °· Descanse todo lo que pueda. Trate de descansar o tomar una siesta mientras el bebé duerme. °· No levante objetos que pesen más que su bebé o más de 10 libras (4,5 kg), como se lo haya indicado el médico. °· Pregúntele al médico cuándo puede volver a tener relaciones sexuales. Esto puede depender de lo siguiente: °? Riesgo de sufrir   infecciones. °? Velocidad de cicatrización. °? Comodidad y deseo de tener relaciones sexuales. °El baño °· No tome baños de inmersión, no nade ni use el jacuzzi hasta que el médico lo autorice.  Pregúntele al médico si puede ducharse. Tal vez solo le permitan darse baños de esponja hasta que la incisión se cure. °· Mantenga el vendaje seco, como se lo haya indicado el médico. °Instrucciones generales °· No use tampones ni se haga duchas vaginales hasta que el médico la autorice. °· Use lo siguiente: °? Ropa cómoda y suelta. °? Un sostén firme y bien ajustado. °· Controle la sangre que elimina por la vagina para detectar coágulos. Pueden tener el aspecto de grumos de color rojo oscuro, marrón o negro. °· Mantenga el perineo limpio y seco, como se lo haya indicado el médico. °· Cuando vaya al baño, siempre higienícese de adelante hacia atrás. °· Si es posible, consiga que alguien la ayude a cuidar de su bebé y con las tareas domésticas durante algunos días después de recibir el alta hospitalaria °· Concurra a todas las visitas de seguimiento para usted y el bebé, como se lo haya indicado el médico. Esto es importante. °SOLICITE ATENCIÓN MÉDICA SI: °· Tiene los siguientes síntomas: °? Una secreción vaginal con mal olor. °? Dificultad para orinar. °? Dolor al orinar. °? Aumento o disminución repentinos de la frecuencia con que defeca. °? Aumento del enrojecimiento, de la hinchazón o del dolor alrededor de la incisión. °? Aumento del líquido o de la sangre que sale de la incisión. °? Pus o mal olor en el lugar de la incisión. °? Fiebre. °? Una erupción cutánea. °? Poco interés o falta de interés en actividades que solían gustarle. °? Dudas sobre su cuidado y el del bebé. °? Náuseas. °· La incisión está caliente al tacto. °· Sus senos se ponen rojos o duros, o causan dolor. °· Siente tristeza o preocupación de forma inusual. °· Vomita. °· Elimina coágulos grandes por la vagina. Si elimina un coágulo de sangre, guárdelo para mostrárselo al médico. No elimine los coágulos sanguíneos por el inodoro sin mostrárselos a su médico. °· Orina más de lo habitual. °· Se siente mareada o se desmaya. °· No amamantó al bebé y  no tuvo su período menstrual en un período de 12 semanas posterior al parto. °· Usted dejó de amamantar al bebé y no tuvo su período menstrual en un período de 12 semanas posterior a haber dejado de amamantar. ° °SOLICITE ATENCIÓN MÉDICA DE INMEDIATO SI: °· Tiene los siguientes síntomas: °? Dolor que no desaparece o no mejora con el medicamento. °? Dolor en el pecho. °? Dificultad para respirar. °? Visión borrosa o manchas en la vista. °? Pensamientos de autolesionarse o lesionar al bebé. °? Nuevo dolor en el abdomen o en una de las piernas. °? Dolor de cabeza intenso. °· Se desmaya. °· Tiene una hemorragia tan intensa de la vagina que empapa dos toallitas sanitarias en una hora. ° °Esta información no tiene como fin reemplazar el consejo del médico. Asegúrese de hacerle al médico cualquier pregunta que tenga. °Document Released: 11/09/2005 Document Revised: 03/02/2016 Document Reviewed: 10/14/2015 °Elsevier Interactive Patient Education © 2017 Elsevier Inc. ° °

## 2017-08-17 NOTE — Progress Notes (Signed)
Eta interpreter in to do DC teaching and answer all questions. Silicone dressing still on and needed removed along with Pressure dressing and honeycomb dressing. All 3 dressings removed and new honeycomb applied per MD order.

## 2017-08-17 NOTE — Lactation Note (Signed)
This note was copied from a baby's chart. Lactation Consultation Note  Patient Name: Angela Decker ZOXWR'[id number] Date: 08/17/2017 Reason for consult: Follow-up assessment;Engorgement;Other (Comment) (resolving by the baby feeding and pumping )  This the 2nd LC visit for today.  Mom has iced and mom latched the baby with the #20 NS , mom comfortable and baby in a consistent feeding pattern and breast softening down. After mom finished feeding the baby for 20 mins( and baby sound asleep.  LC set up the DEBP and had mom pump both breast at the same time and EBM yield >40 ml.  LC rechecked breast and noted the right breast to feel still full, cooler than it was prior to pumping.  LC recommended to mom to eat her lunch , ice for 10-15 mins again , and then feed or pump to get increased relieve.  Mom obtained a  DEBP WIC loaner from the [facility] and $ 30.00 obtained.  Mom aware the OB clinic will be calling her for [facility] O/P appt.  ( discussed earlier when Eda was present)    Maternal Data    Feeding Feeding Type: Breast Fed (left breast softened down somewhat ) Length of feed: 20 min  LATCH Score Latch: Grasps breast easily, tongue down, lips flanged, rhythmical sucking.  Audible Swallowing: Spontaneous and intermittent  Type of Nipple: Everted at rest and after stimulation  Comfort (Breast/Nipple): Filling, red/small blisters or bruises, mild/mod discomfort  Hold (Positioning): No assistance needed to correctly position infant at breast.  LATCH Score: 9  Interventions Interventions: Breast feeding basics reviewed  Lactation Tools Discussed/Used Tools: Flanges;Pump (pumped off >40 ml ) Nipple shield size: 20 Flange Size: 24 (increased bakc up from the #21 ) Shell Type: Inverted Breast pump type: Double-Electric Breast Pump WIC Program: Yes (per mom was signed up yesterday ) Pump Review: Setup, frequency, and cleaning Initiated by:: MAI  Date initiated:: 08/17/17   Consult  Status Consult Status: Follow-up Date: 08/23/17 (requested in the Epic appt. messaging box for MOnday am ) Follow-up type: Out-patient [facility] LC O/P appt. clinic needs to call )    Matilde Sprang Dante Cooter 08/17/2017, 12:04 PM

## 2017-08-18 DIAGNOSIS — Z98891 History of uterine scar from previous surgery: Secondary | ICD-10-CM

## 2017-08-20 ENCOUNTER — Telehealth (HOSPITAL_COMMUNITY): Payer: Self-pay | Admitting: Lactation Services

## 2017-08-20 NOTE — Telephone Encounter (Signed)
A telephone call did not occur, but rather, the patient's mother presented to the security desk with the [facility] Loaner paperwork. Her daughter, Angela Decker, had been discharged with a newborn on 08-17-17. Angela Decker had rented a [facility] loaner, but had accidentally left some pump parts here and had only been able to use the hand pump since then. Today was the first day that this patient's mother could come by to get those pump parts. Likely discarded by now, those parts were unable to be found. The patient's mother was provided another DEBP kit, which was charged to infant's account. Duke Salvia, RN assisted with interpreting. Patient's mother's questions were answered, including how to return the [facility] loaner.  Elinor Dodge, RN, IBCLC

## 2018-06-18 ENCOUNTER — Emergency Department (HOSPITAL_COMMUNITY)
Admission: EM | Admit: 2018-06-18 | Discharge: 2018-06-18 | Disposition: A | Discharge status: Home / Self Care | Payer: Self-pay | Attending: Emergency Medicine | Admitting: Emergency Medicine

## 2018-06-18 ENCOUNTER — Emergency Department (HOSPITAL_COMMUNITY): Payer: Worker's Compensation

## 2018-06-18 ENCOUNTER — Other Ambulatory Visit: Payer: Self-pay

## 2018-06-18 ENCOUNTER — Encounter (HOSPITAL_COMMUNITY): Payer: Self-pay | Admitting: Emergency Medicine

## 2018-06-18 DIAGNOSIS — M25531 Pain in right wrist: Secondary | ICD-10-CM

## 2018-06-18 DIAGNOSIS — W19XXXA Unspecified fall, initial encounter: Secondary | ICD-10-CM

## 2018-06-18 DIAGNOSIS — M25551 Pain in right hip: Secondary | ICD-10-CM | POA: Insufficient documentation

## 2018-06-18 DIAGNOSIS — M545 Low back pain, unspecified: Secondary | ICD-10-CM

## 2018-06-18 DIAGNOSIS — M25552 Pain in left hip: Secondary | ICD-10-CM | POA: Insufficient documentation

## 2018-06-18 LAB — I-STAT BETA HCG BLOOD, ED (MC, WL, AP ONLY): I-stat hCG, quantitative: <5 m[IU]/mL (ref <5)

## 2018-06-18 MED ORDER — IBUPROFEN 200 MG PO TABS
600.0000 mg | ORAL_TABLET | Freq: Once | ORAL | Status: AC
  Administered 2018-06-18: 600 mg via ORAL
  Filled 2018-06-18: qty 1

## 2018-06-18 MED ORDER — METHOCARBAMOL 500 MG PO TABS: 500.0000 mg | ORAL_TABLET | Freq: Two times a day (BID) | ORAL | 0 refills | Status: AC

## 2018-06-18 MED ORDER — IBUPROFEN 600 MG PO TABS: 600.0000 mg | ORAL_TABLET | Freq: Four times a day (QID) | ORAL | 0 refills | Status: DC | PRN

## 2018-06-18 NOTE — ED Provider Notes (Signed)
[facility] EMERGENCY DEPARTMENT Provider Note   CSN: [id number] Arrival date & time: 06/18/18  1115     History   Chief Complaint Chief Complaint  Patient presents with  . Fall    HPI Angela Decker is a 22 y.o. female with no pertinent past medical history who presents to the emergency department by EMS from work with a chief complaint of fall.  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  She reports that she landed on her buttocks with her bilateral hands outstretched beside her.  She did not hit her head.  No LOC, nausea, or emesis.  She reports that she was unable to stand after the fall due to the pain. She was given 200 mcg of fentanyl in route by EMS.  In the ED, she endorses constant, unchanged, moderate bilateral wrist  and hip pain and low back pain.  She denies neck pain, chest pain, abdominal pain, numbness, or weakness.  She does not take any blood thinners.  The history is provided by the patient. A language interpreter was used ([place]).    Past Medical History:  Diagnosis Date  . Medical history non-contributory     Patient Active Problem List   Diagnosis Date Noted  . Status post primary low transverse cesarean section 08/18/2017  . Post term pregnancy at [redacted] weeks gestation 08/14/2017    Past Surgical History:  Procedure Laterality Date  . CESAREAN SECTION N/A 08/14/2017   Procedure: CESAREAN SECTION;  Surgeon: Tereso Newcomer, MD;  Location: [facility];  Service: Obstetrics;  Laterality: N/A;  . NO PAST SURGERIES       OB History    Gravida  1   Para  1   Term  1   Preterm      AB      Living        SAB      TAB      Ectopic      Multiple  0   Live Births               Home Medications    Prior to Admission medications   Medication Sig Start Date End Date Taking? Authorizing Provider  ibuprofen (ADVIL,MOTRIN) 600 MG tablet Take 1 tablet (600  mg total) by mouth every 6 (six) hours as needed. 06/18/18   Kariel Skillman A, PA-C  methocarbamol (ROBAXIN) 500 MG tablet Take 1 tablet (500 mg total) by mouth 2 (two) times daily. 06/18/18   Nevan Creighton A, PA-C  oxyCODONE-acetaminophen (PERCOCET/ROXICET) 5-325 MG tablet Take 1 tablet by mouth every 6 (six) hours as needed. 08/17/17   Pincus Large, DO  Prenatal Vit-Fe Fumarate-FA (PRENATAL MULTIVITAMIN) TABS tablet Take 1 tablet by mouth daily at 12 noon.    [provider]  senna-docusate (SENOKOT-S) 8.6-50 MG tablet Take 2 tablets by mouth at bedtime as needed for mild constipation. 08/17/17   Pincus Large, DO    Family History No family history on file.  Social History Social History   Tobacco Use  . Smoking status: Never Smoker  . Smokeless tobacco: Never Used  Substance Use Topics  . Alcohol use: No  . Drug use: No     Allergies   Patient has no known allergies.   Review of Systems Review of Systems  Constitutional: Negative for activity change.  Cardiovascular: Negative for chest pain.  Gastrointestinal: Negative for abdominal pain, nausea  and vomiting.  Musculoskeletal: Positive for arthralgias, gait problem and myalgias. Negative for back pain, joint swelling and neck pain.  Skin: Negative for rash.  Neurological: Negative for weakness and numbness.     Physical Exam Updated Vital Signs BP 101/64   Pulse (!) 55   Temp 97.9 F (36.6 C) (Oral)   Resp 18   Ht 5\' 4"  (1.626 m)   Wt 72.6 kg (160 lb)   LMP 06/13/2018 (Approximate)   SpO2 98%   BMI 27.46 kg/m   Physical Exam  Constitutional: No distress.  HENT:  Head: Normocephalic.  Eyes: Conjunctivae are normal.  Neck: Neck supple.  Cardiovascular: Normal rate, regular rhythm, normal heart sounds and intact distal pulses. Exam reveals no gallop and no friction rub.  No murmur heard. Pulmonary/Chest: Effort normal. No stridor. No respiratory distress. She has no wheezes. She has no rales. She  exhibits no tenderness.  Abdominal: Soft. She exhibits no distension and no mass. There is no tenderness. There is no rebound and no guarding. No hernia.  Musculoskeletal: She exhibits tenderness.  Diffusely tender to palpation throughout the bilateral low back into the bilateral hips.  No obvious ecchymosis.  No crepitus or step-offs.  She is also tender to palpation over the medial aspect of the distal right forearm.  No obvious deformities.  No cervical or thoracic spinous process or bilateral paraspinal muscle tenderness.  Normal exam of the bilateral knees and ankles.  DP, PT, and radial pulses are 2+ and symmetric.  5 out of 5 strength against resistance of the bilateral upper and lower extremities.  Sensation is intact and equal throughout.  Neurological: She is alert.  Skin: Skin is warm. No rash noted.  Psychiatric: Her behavior is normal.  Nursing note and vitals reviewed.    ED Treatments / Results  Labs (all labs ordered are listed, but only abnormal results are displayed) Labs Reviewed  I-STAT BETA HCG BLOOD, ED ([facility], [facility], [facility] ONLY)    EKG None  Radiology Dg Lumbar Spine Complete  Result Date: 06/18/2018 CLINICAL DATA:  Recent fall with low back pain, initial encounter EXAM: LUMBAR SPINE - COMPLETE 4+ VIEW COMPARISON:  None. FINDINGS: Five lumbar type vertebral bodies are well visualized. Vertebral body height is well maintained. No pars defects are seen. No compression deformity is seen. No anterolisthesis is noted. Radiopaque density is noted over the upper spine on the lateral projection not seen on any of the other films likely extrinsic to the patient. IMPRESSION: No acute bony abnormality is noted. Likely extrinsic artifact on the lateral examination as described. Electronically Signed   By: Alcide Clever M.D.   On: 06/18/2018 14:32   Dg Sacrum/coccyx  Result Date: 06/18/2018 CLINICAL DATA:  Recent fall with pelvic pain, initial encounter EXAM: SACRUM AND COCCYX - 2+  VIEW COMPARISON:  None. FINDINGS: There is no evidence of fracture or other focal bone lesions. IMPRESSION: No acute abnormality noted. Electronically Signed   By: Alcide Clever M.D.   On: 06/18/2018 14:33   Dg Wrist Complete Right  Result Date: 06/18/2018 CLINICAL DATA:  Fall at work, right wrist pain EXAM: RIGHT WRIST - COMPLETE 3+ VIEW COMPARISON:  None. FINDINGS: There is no evidence of fracture or dislocation. There is no evidence of arthropathy or other focal bone abnormality. Soft tissues are unremarkable. IMPRESSION: Negative. Electronically Signed   By: Delbert Phenix M.D.   On: 06/18/2018 14:31   Dg Hips Bilat W Or Wo Pelvis 3-4 Views  Result Date:  06/18/2018 CLINICAL DATA:  Fall with bilateral hip pain, initial encounter EXAM: DG HIP (WITH OR WITHOUT PELVIS) 4V BILAT COMPARISON:  None. FINDINGS: Pelvic ring is intact. No acute fracture or dislocation is noted. No soft tissue changes are seen. No other focal abnormality is noted. IMPRESSION: No acute abnormality noted. Electronically Signed   By: Alcide CleverMark  Lukens M.D.   On: 06/18/2018 14:34    Procedures Procedures (including critical care time)  Medications Ordered in ED Medications  ibuprofen (ADVIL,MOTRIN) tablet 600 mg (600 mg Oral Given 06/18/18 1515)     Initial Impression / Assessment and Plan / ED Course  I have reviewed the triage vital signs and the nursing notes.  Pertinent labs & imaging results that were available during my care of the patient were reviewed by me and considered in my medical decision making (see chart for details).     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  No head injury or LOC.  She was placed in a c-collar by EMS, but did not endorse any neck pain and cervical exam was unremarkable.  She landed on her bilateral buttocks with her bilateral hands outstretched beside her.  Imaging is negative for acute fracture  dislocation.  Ibuprofen given for pain control in the ED.  She was ambulatory prior to discharge.  Brace given for right wrist pain.  She was also given a referral to sports medicine if her wrist does not improve within the next week.  RICE therapy recommended.  Strict return precautions given.  The patient is hemodynamically stable and safe for discharge home at this time.  Final Clinical Impressions(s) / ED Diagnoses   Final diagnoses:  Fall, initial encounter  Acute bilateral low back pain without sciatica  Right wrist pain    ED Discharge Orders        Ordered    ibuprofen (ADVIL,MOTRIN) 600 MG tablet  Every 6 hours PRN     06/18/18 1522    methocarbamol (ROBAXIN) 500 MG tablet  2 times daily     06/18/18 1523       Jerrika Ledlow A, PA-C 06/18/18 1605    Blane OharaZavitz, Joshua, MD 06/19/18 [phone]

## 2018-06-18 NOTE — Discharge Instructions (Signed)
Gracias por permitirme cuidardelo hoy en el [facility] [address] [facility]   Las radiografas no mostraron huesos rotos.  Sin embargo, ya que usted est tierno sobre la [place], debe usar el dispositivo ortopdico que le dieron en el departamento de emergencias para la semana siguiente.  Si el dolor no mejora durante la prxima semana, debe llamar y hacer un seguimiento con medicamentos deportivos.  Tomar 600 mg de ibuprofeno con alimentos o 650 mg de Tylenol cada 6 horas para Human resources officercontrolar el dolor.  Tambin puede alternar entre estos 2 medicamentos cada 3 horas.  Tambin puede tomar 1 comprimido de Robaxina 2 veces al [employer]. [employer] Worldwideda.  Este es un relajante muscular y puede ayudar con el dolor muscular y espasmos.  Aplique hielo durante 15 a 20 minutos hasta 3-4 veces al da para ayudar con el dolor y la hinchazn.  Tambin he unido algunos estiramientos para la espalda baja para ayudar a Becton, Dickinson and Companyevitar que los msculos se aprietan demasiado de su cada.  Regrese al departamento de emergencias si tiene otra cada o lesin, desarrolla vmitos persistentes, sangre en la orina, si orina o hace pop sobre s mismo, u otro nuevo, con respecto a los sntomas.  Thank you for allowing me to care for you today in the Emergency Department.   Your x-rays did not show any broken bones.  However, since you are tender over the wrist, you should wear the brace you were given in the emergency department for the next week.  If your pain does not improve over the next week, he should call and follow-up with sports medicine.  Take 600 mg of ibuprofen with food or 650 mg of Tylenol every 6 hours for pain control.  You can also alternate between these 2 medications every 3 hours.  You can also take 1 tablet of Robaxin 2 times daily.  This is a muscle relaxer and can help with muscle pain and spasms.  Apply ice for 15 to 20 minutes up to 3-4 times a day to help with pain and swelling.  I have also attached some stretches for the low back to help keep the  muscles from getting too tight from your fall.  Return to the emergency department if you  have another fall or injury, develop persistent vomiting, blood in your urine, if you pee or poop on yourself, or other new, concerning symptoms.

## 2018-06-18 NOTE — ED Notes (Signed)
ED Provider at bedside. 

## 2018-06-18 NOTE — ED Notes (Signed)
Mia, PA at bedside at this time.  

## 2018-06-18 NOTE — ED Notes (Signed)
Splint noted on R wrist at time of discharge.

## 2018-06-18 NOTE — ED Notes (Signed)
Ambulated with pt in hallway. Pt able to sit on edge of bed, walk, and return to bed without assistance or assistance device. Pt requires 1x assist to stand from sitting position, reports in creased bilateral hip pain during standing and ambulation (R>L)

## 2018-06-18 NOTE — ED Triage Notes (Signed)
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 Denies LOC. C/o R hand pain, bilateral arm pain and back pain.  C-collar in place.  18 gauge L AC.  EMS administered Fentanyl 50 mcg x 4 = total 200 mcg.  VSS. BP 123/2283f, Pulse 85, 94% on room air.

## 2018-11-30 ENCOUNTER — Emergency Department (HOSPITAL_COMMUNITY): Payer: No Typology Code available for payment source

## 2018-11-30 ENCOUNTER — Emergency Department (HOSPITAL_COMMUNITY)
Admission: EM | Admit: 2018-11-30 | Discharge: 2018-11-30 | Disposition: A | Discharge status: Home / Self Care | Payer: No Typology Code available for payment source | Attending: Emergency Medicine | Admitting: Emergency Medicine

## 2018-11-30 ENCOUNTER — Encounter (HOSPITAL_COMMUNITY): Payer: Self-pay | Admitting: *Deleted

## 2018-11-30 ENCOUNTER — Other Ambulatory Visit: Payer: Self-pay

## 2018-11-30 DIAGNOSIS — Y9389 Activity, other specified: Secondary | ICD-10-CM | POA: Diagnosis not present

## 2018-11-30 DIAGNOSIS — Z79899 Other long term (current) drug therapy: Secondary | ICD-10-CM | POA: Diagnosis not present

## 2018-11-30 DIAGNOSIS — Y999 Unspecified external cause status: Secondary | ICD-10-CM | POA: Diagnosis not present

## 2018-11-30 DIAGNOSIS — Y929 Unspecified place or not applicable: Secondary | ICD-10-CM | POA: Diagnosis not present

## 2018-11-30 DIAGNOSIS — S62321A Displaced fracture of shaft of second metacarpal bone, left hand, initial encounter for closed fracture: Secondary | ICD-10-CM | POA: Insufficient documentation

## 2018-11-30 DIAGNOSIS — S161XXA Strain of muscle, fascia and tendon at neck level, initial encounter: Secondary | ICD-10-CM

## 2018-11-30 DIAGNOSIS — M25552 Pain in left hip: Secondary | ICD-10-CM | POA: Insufficient documentation

## 2018-11-30 DIAGNOSIS — S0990XA Unspecified injury of head, initial encounter: Secondary | ICD-10-CM | POA: Diagnosis present

## 2018-11-30 DIAGNOSIS — S62391B Other fracture of second metacarpal bone, left hand, initial encounter for open fracture: Secondary | ICD-10-CM

## 2018-11-30 LAB — BASIC METABOLIC PANEL
ANION GAP: 7 (ref 5–15)
BUN: 7 mg/dL (ref 6–20)
CALCIUM: 9.3 mg/dL (ref 8.9–10.3)
CO2: 24 mmol/L (ref 22–32)
Chloride: 105 mmol/L (ref 98–111)
Creatinine, Ser: 0.64 mg/dL (ref 0.44–1.00)
GFR calc Af Amer: >60 mL/min (ref >60)
GLUCOSE: 103 mg/dL — AB (ref 70–99)
Potassium: 3.7 mmol/L (ref 3.5–5.1)
Sodium: 136 mmol/L (ref 135–145)

## 2018-11-30 LAB — CBC WITH DIFFERENTIAL/PLATELET
ABS IMMATURE GRANULOCYTES: 0.03 10*3/uL (ref 0.00–0.07)
Basophils Absolute: 0 10*3/uL (ref 0.0–0.1)
Basophils Relative: 0 %
EOS PCT: 1 %
Eosinophils Absolute: 0.1 10*3/uL (ref 0.0–0.5)
HCT: 40.4 % (ref 36.0–46.0)
HEMOGLOBIN: 12.9 g/dL (ref 12.0–15.0)
Immature Granulocytes: 0 %
LYMPHS ABS: 1.6 10*3/uL (ref 0.7–4.0)
LYMPHS PCT: 15 %
MCH: 28.9 pg (ref 26.0–34.0)
MCHC: 31.9 g/dL (ref 30.0–36.0)
MCV: 90.4 fL (ref 80.0–100.0)
MONO ABS: 0.4 10*3/uL (ref 0.1–1.0)
Monocytes Relative: 4 %
NEUTROS ABS: 8.2 10*3/uL — AB (ref 1.7–7.7)
Neutrophils Relative %: 80 %
Platelets: 215 10*3/uL (ref 150–400)
RBC: 4.47 MIL/uL (ref 3.87–5.11)
RDW: 13 % (ref 11.5–15.5)
WBC: 10.4 10*3/uL (ref 4.0–10.5)
nRBC: 0 % (ref 0.0–0.2)

## 2018-11-30 LAB — ABO/RH: ABO/RH(D): A POS

## 2018-11-30 LAB — TYPE AND SCREEN
ABO/RH(D): A POS
Antibody Screen: NEGATIVE

## 2018-11-30 LAB — I-STAT BETA HCG BLOOD, ED (MC, WL, AP ONLY): I-stat hCG, quantitative: <5 m[IU]/mL (ref <5)

## 2018-11-30 MED ORDER — IBUPROFEN 600 MG PO TABS: 600.0000 mg | ORAL_TABLET | Freq: Four times a day (QID) | ORAL | 0 refills | Status: AC | PRN

## 2018-11-30 MED ORDER — CYCLOBENZAPRINE HCL 5 MG PO TABS
7.5000 mg | ORAL_TABLET | Freq: Once | ORAL | Status: AC
  Administered 2018-11-30: 7.5 mg via ORAL
  Filled 2018-11-30: qty 1.5

## 2018-11-30 MED ORDER — CEPHALEXIN 500 MG PO CAPS: 500.0000 mg | ORAL_CAPSULE | Freq: Three times a day (TID) | ORAL | 0 refills | Status: AC

## 2018-11-30 MED ORDER — CEFAZOLIN SODIUM-DEXTROSE 2-4 GM/100ML-% IV SOLN
2.0000 g | Freq: Once | INTRAVENOUS | Status: AC
  Administered 2018-11-30: 2 g via INTRAVENOUS
  Filled 2018-11-30: qty 100

## 2018-11-30 MED ORDER — ONDANSETRON HCL 4 MG/2ML IJ SOLN
4.0000 mg | Freq: Once | INTRAMUSCULAR | Status: AC
  Administered 2018-11-30: 4 mg via INTRAVENOUS
  Filled 2018-11-30: qty 2

## 2018-11-30 MED ORDER — TETANUS-DIPHTH-ACELL PERTUSSIS 5-2.5-18.5 LF-MCG/0.5 IM SUSP
0.5000 mL | Freq: Once | INTRAMUSCULAR | Status: AC
  Administered 2018-11-30: 0.5 mL via INTRAMUSCULAR
  Filled 2018-11-30: qty 0.5

## 2018-11-30 MED ORDER — CYCLOBENZAPRINE HCL 10 MG PO TABS: 10.0000 mg | ORAL_TABLET | Freq: Two times a day (BID) | ORAL | 0 refills | Status: AC | PRN

## 2018-11-30 MED ORDER — OXYCODONE-ACETAMINOPHEN 5-325 MG PO TABS: 1.0000 | ORAL_TABLET | ORAL | 0 refills | Status: AC | PRN

## 2018-11-30 MED ORDER — FENTANYL CITRATE (PF) 100 MCG/2ML IJ SOLN
50.0000 ug | Freq: Once | INTRAMUSCULAR | Status: AC
  Administered 2018-11-30: 50 ug via INTRAVENOUS
  Filled 2018-11-30: qty 2

## 2018-11-30 MED ORDER — IBUPROFEN 400 MG PO TABS
600.0000 mg | ORAL_TABLET | Freq: Once | ORAL | Status: AC
  Administered 2018-11-30: 600 mg via ORAL
  Filled 2018-11-30: qty 1

## 2018-11-30 MED ORDER — HYDROMORPHONE HCL 1 MG/ML IJ SOLN
1.0000 mg | Freq: Once | INTRAMUSCULAR | Status: AC
  Administered 2018-11-30: 1 mg via INTRAVENOUS
  Filled 2018-11-30: qty 1

## 2018-11-30 MED ORDER — LIDOCAINE-EPINEPHRINE (PF) 2 %-1:200000 IJ SOLN
5.0000 mL | Freq: Once | INTRAMUSCULAR | Status: AC
  Administered 2018-11-30: 5 mL
  Filled 2018-11-30: qty 20

## 2018-11-30 MED ORDER — OXYCODONE-ACETAMINOPHEN 5-325 MG PO TABS
1.0000 | ORAL_TABLET | Freq: Once | ORAL | Status: AC
  Administered 2018-11-30: 1 via ORAL
  Filled 2018-11-30: qty 1
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
## 2018-11-30 NOTE — ED Notes (Signed)
Ortho tech completed tasks. Will discharge pt.

## 2018-11-30 NOTE — ED Notes (Signed)
Pt alert and oriented in NAD. Pt and family verbalized understanding of discharge instructions.  
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
## 2018-11-30 NOTE — ED Notes (Signed)
Spoke with ortho tech. He is coming to splint and apply sling to pt.

## 2018-11-30 NOTE — Discharge Instructions (Addendum)
Please read instructions below. Apply ice to your areas of pain for 20 minutes at a time. Keep the splint on at all times. Keep it dry and clean. You can take 600 mg of Advil/ibuprofen every 6 hours as needed for mild to moderate pain, as well as swelling. You can take oxycodone every 4-6 hours for severe pain.  Do not take Tylenol, drive, or drink alcohol while taking this medication. You can take flexeril every 12 hours as needed for muscle spasm. Be aware this medication can make you drowsy. Call the hand surgeon's office tomorrow morning to schedule an appointment for this week.  They are expecting your call. Return to the ER for severely worsening headache, vision changes, if new numbness or tingling in your arms or legs, inability to urinate, inability to hold your bowels, or weakness in your extremities.   Por favor, lea las instrucciones a continuacin. Aplique hielo en sus reas de dolor durante 20 minutos a la vez. Mantenga la frula encendida en todo momento. Mantngalo seco y limpio. Puede tomar 600 mg de Advil / ibuprofeno cada 6 horas segn sea necesario para el dolor leve a moderado, as como la hinchazn. Puede tomar oxicodona cada 4-6 horas para el dolor intenso. No tome Tylenol, maneje ni beba alcohol mientras est tomando este medicamento. Puede tomar flexeril cada 12 horas segn sea necesario para el espasmo muscular. Tenga en cuenta que este medicamento puede causar somnolencia. Llame a la oficina del cirujano de mano maana por la maana para programar una cita para esta semana. Esperan tu llamada. Regrese a la sala de emergencias por un dolor de cabeza que empeora severamente, cambios en la visin, si hay un nuevo entumecimiento u hormigueo en sus brazos o piernas, incapacidad para orinar, incapacidad para retener los intestinos o debilidad en las extremidades.
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
## 2018-12-02 ENCOUNTER — Other Ambulatory Visit: Payer: Self-pay

## 2018-12-02 ENCOUNTER — Encounter (HOSPITAL_COMMUNITY): Payer: Self-pay | Admitting: *Deleted
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
## 2018-12-05 ENCOUNTER — Ambulatory Visit (HOSPITAL_COMMUNITY): Payer: No Typology Code available for payment source | Admitting: Certified Registered Nurse Anesthetist

## 2018-12-05 ENCOUNTER — Encounter (HOSPITAL_COMMUNITY): Payer: Self-pay | Admitting: Certified Registered"

## 2018-12-05 ENCOUNTER — Ambulatory Visit (HOSPITAL_COMMUNITY)
Admission: RE | Admit: 2018-12-05 | Discharge: 2018-12-05 | Disposition: A | Discharge status: Home / Self Care | Payer: No Typology Code available for payment source | Source: Ambulatory Visit | Attending: Orthopedic Surgery | Admitting: Orthopedic Surgery

## 2018-12-05 ENCOUNTER — Encounter (HOSPITAL_COMMUNITY)
Admission: RE | Disposition: A | Discharge status: Home / Self Care | Payer: Self-pay | Source: Ambulatory Visit | Attending: Orthopedic Surgery

## 2018-12-05 DIAGNOSIS — S61412A Laceration without foreign body of left hand, initial encounter: Secondary | ICD-10-CM | POA: Insufficient documentation

## 2018-12-05 DIAGNOSIS — S62391A Other fracture of second metacarpal bone, left hand, initial encounter for closed fracture: Secondary | ICD-10-CM | POA: Diagnosis not present

## 2018-12-05 DIAGNOSIS — S63651A Sprain of metacarpophalangeal joint of left index finger, initial encounter: Secondary | ICD-10-CM | POA: Insufficient documentation

## 2018-12-05 HISTORY — PX: OPEN REDUCTION INTERNAL FIXATION (ORIF) METACARPAL: SHX6234

## 2018-12-05 HISTORY — DX: Other injury of unspecified body region, initial encounter: T14.8XXA

## 2018-12-05 HISTORY — DX: Headache: R51

## 2018-12-05 HISTORY — DX: Headache, unspecified: R51.9

## 2018-12-05 LAB — POCT PREGNANCY, URINE: Preg Test, Ur: NEGATIVE

## 2018-12-05 SURGERY — OPEN REDUCTION INTERNAL FIXATION (ORIF) METACARPAL
Anesthesia: Regional | Site: Hand | Laterality: Left

## 2018-12-05 MED ORDER — 0.9 % SODIUM CHLORIDE (POUR BTL) OPTIME
TOPICAL | Status: DC | PRN
  Administered 2018-12-05: 1000 mL

## 2018-12-05 MED ORDER — MIDAZOLAM HCL 2 MG/2ML IJ SOLN
2.0000 mg | Freq: Once | INTRAMUSCULAR | Status: AC
  Administered 2018-12-05: 2 mg via INTRAVENOUS

## 2018-12-05 MED ORDER — LIDOCAINE 2% (20 MG/ML) 5 ML SYRINGE
INTRAMUSCULAR | Status: DC | PRN
  Administered 2018-12-05: 60 mg via INTRAVENOUS

## 2018-12-05 MED ORDER — BUPIVACAINE HCL (PF) 0.25 % IJ SOLN
INTRAMUSCULAR | Status: AC
  Filled 2018-12-05: qty 30

## 2018-12-05 MED ORDER — FENTANYL CITRATE (PF) 100 MCG/2ML IJ SOLN: 25.0000 ug | INTRAMUSCULAR | Status: DC | PRN

## 2018-12-05 MED ORDER — ROPIVACAINE HCL 7.5 MG/ML IJ SOLN
INTRAMUSCULAR | Status: DC | PRN
  Administered 2018-12-05: 20 mL via PERINEURAL

## 2018-12-05 MED ORDER — ONDANSETRON HCL 4 MG/2ML IJ SOLN
INTRAMUSCULAR | Status: DC | PRN
  Administered 2018-12-05: 4 mg via INTRAVENOUS

## 2018-12-05 MED ORDER — MIDAZOLAM HCL 2 MG/2ML IJ SOLN
INTRAMUSCULAR | Status: AC
  Administered 2018-12-05: 2 mg via INTRAVENOUS
  Filled 2018-12-05: qty 2

## 2018-12-05 MED ORDER — CHLORHEXIDINE GLUCONATE 4 % EX LIQD: 60.0000 mL | Freq: Once | CUTANEOUS | Status: DC

## 2018-12-05 MED ORDER — DEXAMETHASONE SODIUM PHOSPHATE 10 MG/ML IJ SOLN
INTRAMUSCULAR | Status: DC | PRN
  Administered 2018-12-05: 10 mg via INTRAVENOUS

## 2018-12-05 MED ORDER — CEFAZOLIN SODIUM-DEXTROSE 2-4 GM/100ML-% IV SOLN
2.0000 g | INTRAVENOUS | Status: AC
  Administered 2018-12-05: 2 g via INTRAVENOUS

## 2018-12-05 MED ORDER — DEXAMETHASONE SODIUM PHOSPHATE 10 MG/ML IJ SOLN
INTRAMUSCULAR | Status: AC
  Filled 2018-12-05: qty 1

## 2018-12-05 MED ORDER — LACTATED RINGERS IV SOLN
INTRAVENOUS | Status: DC
  Administered 2018-12-05: 14:00:00 via INTRAVENOUS

## 2018-12-05 MED ORDER — FENTANYL CITRATE (PF) 100 MCG/2ML IJ SOLN
INTRAMUSCULAR | Status: AC
  Filled 2018-12-05: qty 2

## 2018-12-05 MED ORDER — ONDANSETRON HCL 4 MG/2ML IJ SOLN
INTRAMUSCULAR | Status: AC
  Filled 2018-12-05: qty 2

## 2018-12-05 MED ORDER — CEFAZOLIN SODIUM-DEXTROSE 2-4 GM/100ML-% IV SOLN
INTRAVENOUS | Status: AC
  Filled 2018-12-05: qty 100

## 2018-12-05 MED ORDER — BUPIVACAINE HCL (PF) 0.25 % IJ SOLN
INTRAMUSCULAR | Status: DC | PRN
  Administered 2018-12-05: 20 mL

## 2018-12-05 MED ORDER — PROPOFOL 10 MG/ML IV BOLUS
INTRAVENOUS | Status: DC | PRN
  Administered 2018-12-05: 150 mg via INTRAVENOUS

## 2018-12-05 SURGICAL SUPPLY — 55 items
ANCHOR JUGGERKNOT 1.0 1DR 2-0 (Anchor) ×3 IMPLANT
BANDAGE ACE 3X5.8 VEL STRL LF (GAUZE/BANDAGES/DRESSINGS) ×3 IMPLANT
BANDAGE ACE 4X5 VEL STRL LF (GAUZE/BANDAGES/DRESSINGS) ×3 IMPLANT
BIT DRILL 1.8 CANN MAX VPC (BIT) ×3 IMPLANT
BNDG CONFORM 3 STRL LF (GAUZE/BANDAGES/DRESSINGS) ×6 IMPLANT
BNDG ESMARK 4X9 LF (GAUZE/BANDAGES/DRESSINGS) ×3 IMPLANT
BNDG GAUZE ELAST 4 BULKY (GAUZE/BANDAGES/DRESSINGS) ×3 IMPLANT
CLOSURE WOUND 1/2 X4 (GAUZE/BANDAGES/DRESSINGS)
CORDS BIPOLAR (ELECTRODE) ×3 IMPLANT
COVER SURGICAL LIGHT HANDLE (MISCELLANEOUS) ×3 IMPLANT
COVER WAND RF STERILE (DRAPES) ×3 IMPLANT
CUFF TOURNIQUET SINGLE 18IN (TOURNIQUET CUFF) ×3 IMPLANT
DRAPE OEC MINIVIEW 54X84 (DRAPES) ×3 IMPLANT
DRAPE SURG 17X11 SM STRL (DRAPES) ×3 IMPLANT
DRSG ADAPTIC 3X8 NADH LF (GAUZE/BANDAGES/DRESSINGS) ×3 IMPLANT
DRSG EMULSION OIL 3X3 NADH (GAUZE/BANDAGES/DRESSINGS) ×3 IMPLANT
GAUZE 4X4 16PLY RFD (DISPOSABLE) IMPLANT
GAUZE SPONGE 4X4 12PLY STRL (GAUZE/BANDAGES/DRESSINGS) ×3 IMPLANT
GAUZE XEROFORM 1X8 LF (GAUZE/BANDAGES/DRESSINGS) ×3 IMPLANT
GLOVE BIOGEL PI IND STRL 8.5 (GLOVE) ×1 IMPLANT
GLOVE BIOGEL PI INDICATOR 8.5 (GLOVE) ×2
GLOVE SURG ORTHO 8.0 STRL STRW (GLOVE) ×3 IMPLANT
GOWN STRL REUS W/ TWL LRG LVL3 (GOWN DISPOSABLE) ×2 IMPLANT
GOWN STRL REUS W/ TWL XL LVL3 (GOWN DISPOSABLE) ×1 IMPLANT
GOWN STRL REUS W/TWL LRG LVL3 (GOWN DISPOSABLE) ×4
GOWN STRL REUS W/TWL XL LVL3 (GOWN DISPOSABLE) ×2
K-WIRE COCR 0.9X95 (WIRE) ×9
K-WIRE THREADED 1.25 (WIRE) ×3
KIT BASIN OR (CUSTOM PROCEDURE TRAY) ×3 IMPLANT
KIT TURNOVER KIT B (KITS) ×3 IMPLANT
KWIRE COCR 0.9X95 (WIRE) ×3 IMPLANT
KWIRE THREADED 1.25 (WIRE) ×1 IMPLANT
MANIFOLD NEPTUNE II (INSTRUMENTS) ×3 IMPLANT
NEEDLE HYPO 25X1 1.5 SAFETY (NEEDLE) ×3 IMPLANT
NS IRRIG 1000ML POUR BTL (IV SOLUTION) ×3 IMPLANT
PACK ORTHO EXTREMITY (CUSTOM PROCEDURE TRAY) ×3 IMPLANT
PAD ARMBOARD 7.5X6 YLW CONV (MISCELLANEOUS) ×6 IMPLANT
PAD CAST 4YDX4 CTTN HI CHSV (CAST SUPPLIES) ×1 IMPLANT
PADDING CAST COTTON 4X4 STRL (CAST SUPPLIES) ×2
SCREW VPC 2.5X12 (Screw) ×3 IMPLANT
SOAP 2 % CHG 4 OZ (WOUND CARE) ×3 IMPLANT
STRIP CLOSURE SKIN 1/2X4 (GAUZE/BANDAGES/DRESSINGS) IMPLANT
SUT ETHILON 4 0 PS 2 18 (SUTURE) IMPLANT
SUT MNCRL AB 4-0 PS2 18 (SUTURE) IMPLANT
SUT PROLENE 4 0 PS 2 18 (SUTURE) ×3 IMPLANT
SUT VIC AB 2-0 FS1 27 (SUTURE) IMPLANT
SUT VIC AB 3-0 FS2 27 (SUTURE) ×3 IMPLANT
SUT VIC AB 3-0 SH 27 (SUTURE) ×2
SUT VIC AB 3-0 SH 27X BRD (SUTURE) ×1 IMPLANT
SUT VICRYL 4-0 PS2 18IN ABS (SUTURE) IMPLANT
SYR CONTROL 10ML LL (SYRINGE) ×3 IMPLANT
TOWEL OR 17X26 10 PK STRL BLUE (TOWEL DISPOSABLE) ×3 IMPLANT
TUBE CONNECTING 12'X1/4 (SUCTIONS) ×1
TUBE CONNECTING 12X1/4 (SUCTIONS) ×2 IMPLANT
YANKAUER SUCT BULB TIP NO VENT (SUCTIONS) IMPLANT

## 2018-12-05 NOTE — Anesthesia Preprocedure Evaluation (Addendum)
Anesthesia Evaluation  Patient identified by MRN, date of birth, ID band Patient awake    Reviewed: Allergy & Precautions, NPO status , Patient's Chart, lab work & pertinent test results  Airway Mallampati: I  TM Distance: >3 FB Neck ROM: Full    Dental no notable dental hx. (+) Teeth Intact, Dental Advisory Given   Pulmonary neg pulmonary ROS,    Pulmonary exam normal breath sounds clear to auscultation       Cardiovascular negative cardio ROS Normal cardiovascular exam Rhythm:Regular Rate:Normal     Neuro/Psych  Headaches, negative psych ROS   GI/Hepatic negative GI ROS, Neg liver ROS,   Endo/Other  negative endocrine ROS  Renal/GU negative Renal ROS  negative genitourinary   Musculoskeletal negative musculoskeletal ROS (+)   Abdominal   Peds negative pediatric ROS (+)  Hematology negative hematology ROS (+)   Anesthesia Other Findings Left index metacarpal fracture  Reproductive/Obstetrics negative OB ROS                           Anesthesia Physical Anesthesia Plan  ASA: I  Anesthesia Plan: General and Regional   Post-op Pain Management:    Induction: Intravenous  PONV Risk Score and Plan: 3 and Midazolam, Dexamethasone and Ondansetron  Airway Management Planned: Oral ETT and LMA  Additional Equipment:   Intra-op Plan:   Post-operative Plan: Extubation in OR  Informed Consent: I have reviewed the patients History and Physical, chart, labs and discussed the procedure including the risks, benefits and alternatives for the proposed anesthesia with the patient or authorized representative who has indicated his/her understanding and acceptance.   Dental advisory given  Plan Discussed with: CRNA  Anesthesia Plan Comments:         Anesthesia Quick Evaluation

## 2018-12-05 NOTE — Anesthesia Procedure Notes (Signed)
Procedure Name: LMA Insertion Date/Time: 12/05/2018 3:47 PM Performed by: De Nurse, CRNA Pre-anesthesia Checklist: Patient identified, Emergency Drugs available, Suction available and Patient being monitored Patient Re-evaluated:Patient Re-evaluated prior to induction Oxygen Delivery Method: Circle System Utilized Preoxygenation: Pre-oxygenation with 100% oxygen Induction Type: IV induction Ventilation: Mask ventilation without difficulty LMA: LMA inserted LMA Size: 4.0 Number of attempts: 1 Placement Confirmation: positive ETCO2 Tube secured with: Tape Dental Injury: Teeth and Oropharynx as per pre-operative assessment

## 2018-12-05 NOTE — Anesthesia Procedure Notes (Signed)
Anesthesia Regional Block: Supraclavicular block   Pre-Anesthetic Checklist: ,, timeout performed, Correct Patient, Correct Site, Correct Laterality, Correct Procedure, Correct Position, site marked, Risks and benefits discussed,  Surgical consent,  Pre-op evaluation,  At surgeon's request and post-op pain management  Laterality: Left  Prep: Maximum Sterile Barrier Precautions used, chloraprep       Needles:  Injection technique: Single-shot  Needle Type: Echogenic Stimulator Needle     Needle Length: 9cm  Needle Gauge: 22     Additional Needles:   Procedures:,,,, ultrasound used (permanent image in chart),,,,  Narrative:  Start time: 12/05/2018 2:56 PM End time: 12/05/2018 3:06 PM Injection made incrementally with aspirations every 5 mL.  Performed by: Personally  Anesthesiologist: Elmer Picker, MD  Additional Notes: Monitors applied. No increased pain on injection. No increased resistance to injection. Injection made in 5cc increments. Good needle visualization. Patient tolerated procedure well.

## 2018-12-05 NOTE — Transfer of Care (Signed)
Immediate Anesthesia Transfer of Care Note  Patient: Angela Decker  Procedure(s) Performed: LEFT INDEX OPEN REDUCTION INTERNAL FIXATION (ORIF) METACARPAL AND REPAIR AS INDICATED (Left Hand)  Patient Location: PACU  Anesthesia Type:General and Regional  Level of Consciousness: awake and oriented  Airway & Oxygen Therapy: Patient Spontanous Breathing  Post-op Assessment: Report given to RN  Post vital signs: Reviewed and stable  Last Vitals:  Vitals Value Taken Time  BP 109/52 12/05/2018  5:21 PM  Temp    Pulse 85 12/05/2018  5:25 PM  Resp 20 12/05/2018  5:25 PM  SpO2 98 % 12/05/2018  5:25 PM  Vitals shown include unvalidated device data.  Last Pain:  Vitals:   12/05/18 1419  TempSrc:   PainSc: 7       Patients Stated Pain Goal: 5 (12/05/18 1419)  Complications: No apparent anesthesia complications

## 2018-12-05 NOTE — Progress Notes (Signed)
[place] Spanish interpreter used for preop interview.
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
## 2018-12-05 NOTE — Discharge Instructions (Addendum)
KEEP BANDAGE CLEAN AND DRY CALL OFFICE FOR F/U APPT [phone] IN 15 DAYS KEEP HAND ELEVATED ABOVE HEART OK TO APPLY ICE TO OPERATIVE AREA CONTACT OFFICE IF ANY WORSENING PAIN OR CONCERNS.  

## 2018-12-05 NOTE — Progress Notes (Signed)
Orthopedic Tech Progress Note Patient Details:  Angela Decker [birth year] [id number] Dropped off arm sling to RN Patient ID: Angela Decker, female   DOB: 06-30-1996, 23 y.o.   MRN: [id number]   Angela Decker 12/05/2018, 6:21 PM

## 2018-12-07 ENCOUNTER — Encounter (HOSPITAL_COMMUNITY): Payer: Self-pay | Admitting: Orthopedic Surgery

## 2018-12-07 NOTE — Anesthesia Postprocedure Evaluation (Signed)
Anesthesia Post Note  Patient: Dilara Wyllie  Procedure(s) Performed: LEFT INDEX OPEN REDUCTION INTERNAL FIXATION (ORIF) METACARPAL AND REPAIR AS INDICATED (Left Hand)     Patient location during evaluation: PACU Anesthesia Type: Regional and General Level of consciousness: awake and alert Pain management: pain level controlled Vital Signs Assessment: post-procedure vital signs reviewed and stable Respiratory status: spontaneous breathing, nonlabored ventilation, respiratory function stable and patient connected to nasal cannula oxygen Cardiovascular status: blood pressure returned to baseline and stable Postop Assessment: no apparent nausea or vomiting Anesthetic complications: no    Last Vitals:  Vitals:   12/05/18 1735 12/05/18 1750  BP: 114/66 113/70  Pulse: 67 72  Resp: 15 16  Temp:  36.7 C  SpO2: 100% 100%    Last Pain:  Vitals:   12/05/18 1750  TempSrc:   PainSc: 0-No pain                 Lizzette Carbonell L Jerene Yeager
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
